# Patient Record
Sex: Female | Born: 1965 | Race: Black or African American | Hispanic: No | Marital: Married | Smoking: Never smoker
Health system: Southern US, Community
[De-identification: ages and names within clinical notes are randomized; demographics above are authoritative.]

## PROBLEM LIST (undated history)

## (undated) DIAGNOSIS — K5900 Constipation, unspecified: Secondary | ICD-10-CM

## (undated) DIAGNOSIS — R7611 Nonspecific reaction to tuberculin skin test without active tuberculosis: Secondary | ICD-10-CM

## (undated) DIAGNOSIS — G43909 Migraine, unspecified, not intractable, without status migrainosus: Secondary | ICD-10-CM

## (undated) DIAGNOSIS — N393 Stress incontinence (female) (male): Secondary | ICD-10-CM

## (undated) HISTORY — DX: Nonspecific reaction to tuberculin skin test without active tuberculosis: R76.11

## (undated) HISTORY — DX: Stress incontinence (female) (male): N39.3

---

## 1997-12-07 HISTORY — PX: TUBAL LIGATION: SHX77

## 2011-08-09 LAB — HEMOGLOBIN A1C: Hgb A1c MFr Bld: 6 % (ref 4.0–6.0)

## 2011-08-09 LAB — BASIC METABOLIC PANEL
Creatinine: 0.6 mg/dL (ref <=1.1)
Glucose: 78 mg/dL
Potassium: 4.3 mmol/L (ref 3.4–5.3)
Sodium: 142 mmol/L (ref 137–147)

## 2011-09-05 ENCOUNTER — Emergency Department: Payer: Self-pay | Admitting: Emergency Medicine

## 2011-09-11 ENCOUNTER — Encounter: Payer: Self-pay | Admitting: Family Medicine

## 2011-09-11 ENCOUNTER — Ambulatory Visit (INDEPENDENT_AMBULATORY_CARE_PROVIDER_SITE_OTHER): Payer: Managed Care, Other (non HMO) | Admitting: Family Medicine

## 2011-09-11 VITALS — BP 140/100 | HR 80 | Temp 98.7°F | Ht 63.0 in | Wt 174.5 lb

## 2011-09-11 DIAGNOSIS — Z1231 Encounter for screening mammogram for malignant neoplasm of breast: Secondary | ICD-10-CM

## 2011-09-11 DIAGNOSIS — N393 Stress incontinence (female) (male): Secondary | ICD-10-CM | POA: Insufficient documentation

## 2011-09-11 DIAGNOSIS — R7611 Nonspecific reaction to tuberculin skin test without active tuberculosis: Secondary | ICD-10-CM

## 2011-09-11 DIAGNOSIS — Z0000 Encounter for general adult medical examination without abnormal findings: Secondary | ICD-10-CM

## 2011-09-11 DIAGNOSIS — E559 Vitamin D deficiency, unspecified: Secondary | ICD-10-CM | POA: Insufficient documentation

## 2011-09-11 NOTE — Patient Instructions (Signed)
Good to see you. Please stop by to see Shirlee Limerick on your way out.   Constipation in Adults Constipation is having fewer than 2 bowel movements per week. Usually, the stools are hard. As we grow older, constipation is more common. If you try to fix constipation with laxatives, the problem may get worse. This is because laxatives taken over a long period of time make the colon muscles weaker. A low-fiber diet, not taking in enough fluids, and taking some medicines may make these problems worse. MEDICATIONS THAT MAY CAUSE CONSTIPATION  Water pills (diuretics).  Calcium channel blockers (used to control blood pressure and for the heart).   Certain pain medicines (narcotics).   Anticholinergics.  Anti-inflammatory agents.   Antacids that contain aluminum.   DISEASES THAT CONTRIBUTE TO CONSTIPATION  Diabetes.  Parkinson's disease.   Dementia.   Stroke.  Depression.   Illnesses that cause problems with salt and water metabolism.   HOME CARE INSTRUCTIONS  Constipation is usually best cared for without medicines. Increasing dietary fiber and eating more fruits and vegetables is the best way to manage constipation.   Slowly increase fiber intake to 25 to 38 grams per day. Whole grains, fruits, vegetables, and legumes are good sources of fiber. A dietitian can further help you incorporate high-fiber foods into your diet.   Drink enough water and fluids to keep your urine clear or pale yellow.   A fiber supplement may be added to your diet if you cannot get enough fiber from foods.   Increasing your activities also helps improve regularity.   Suppositories, as suggested by your caregiver, will also help. If you are using antacids, such as aluminum or calcium containing products, it will be helpful to switch to products containing magnesium if your caregiver says it is okay.   If you have been given a liquid injection (enema) today, this is only a temporary measure. It should not be relied  on for treatment of longstanding (chronic) constipation.   Stronger measures, such as magnesium sulfate, should be avoided if possible. This may cause uncontrollable diarrhea. Using magnesium sulfate may not allow you time to make it to the bathroom.  SEEK IMMEDIATE MEDICAL CARE IF:  There is bright red blood in the stool.   The constipation stays for more than 4 days.   There is belly (abdominal) or rectal pain.   You do not seem to be getting better.   You have any questions or concerns.  MAKE SURE YOU:  Understand these instructions.   Will watch your condition.   Will get help right away if you are not doing well or get worse.  Document Released: 08/21/2004 Document Re-Released: 02/17/2010 [facility] Patient Information 2011 [place], [place].

## 2011-09-11 NOTE — Progress Notes (Signed)
Subjective:    Patient ID: Colleen Haley, female    DOB: [birth year], 45 y.o.   MRN: [id number]  HPI  45 yo female here to establish care.  Recently moved here from [place]. 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  G2P2, last pap smear was 04/2011.  No h/o abnormal pap smears.  Vitamin D deficiency, Vit D was 13 in April.  She was feeling tired at the time but was not sure if it was because she was working full time and going to school. Taking Vit D 1000 IU daily.  Stress incontinence- ongoing issue for several years, getting progressively worse. Tried Kegel exercises, not helping. No dysuria.   Patient Active Problem List  Diagnoses  . Routine general medical examination at a health care facility  . Stress incontinence, female  . Positive TB test   Past Medical History  Diagnosis Date  . Positive TB test   . Stress incontinence, female    Past Surgical History  Procedure Date  . Tubal ligation 1999   History  Substance Use Topics  . Smoking status: Never Smoker   . Smokeless tobacco: Not on file  . Alcohol Use: 0.5 oz/week    1 drink(s) per week   Family History  Problem Relation Age of Onset  . Hyperlipidemia Mother   . Hypertension Mother   . Diabetes Mother   . Hyperlipidemia Father   . Hypertension Father   . Glaucoma Father    No Known Allergies No current outpatient prescriptions on file prior to visit.   The PMH, PSH, Social History, Family History, Medications, and allergies have been reviewed in [facility], and have been updated if relevant.   Review of Systems See HPI Patient reports no  vision/ hearing changes,anorexia, weight change, fever ,adenopathy, persistant / recurrent hoarseness, swallowing issues, chest pain, edema,persistant / recurrent cough, hemoptysis, dyspnea(rest, exertional, paroxysmal nocturnal), gastrointestinal  bleeding (melena, rectal bleeding), abdominal pain, excessive heart burn, GU symptoms(dysuria, hematuria, pyuria,  voiding/incontinence  Issues) syncope, focal weakness, severe memory loss, concerning skin lesions, depression, anxiety, abnormal bruising/bleeding, major joint swelling, breast masses or abnormal vaginal bleeding.       Objective:   Physical Exam BP 140/100  Pulse 80  Temp(Src) 98.7 F (37.1 C) (Oral)  Ht 5\' 3"  (1.6 m)  Wt 174 lb 8 oz (79.153 kg)  BMI 30.91 kg/m2  General:  Well-developed,well-nourished,in no acute distress; alert,appropriate and cooperative throughout examination Head:  normocephalic and atraumatic.   Eyes:  vision grossly intact, pupils equal, pupils round, and pupils reactive to light.   Ears:  R ear normal and L ear normal.   Nose:  no external deformity.   Mouth:  good dentition.   Neck:  No deformities, masses, or tenderness noted. Lungs:  Normal respiratory effort, chest expands symmetrically. Lungs are clear to auscultation, no crackles or wheezes. Heart:  Normal rate and regular rhythm. S1 and S2 normal without gallop, murmur, click, rub or other extra sounds. Abdomen:  Bowel sounds positive,abdomen soft and non-tender without masses, organomegaly or hernias noted. Msk:  No deformity or scoliosis noted of thoracic or lumbar spine.   Extremities:  No clubbing, cyanosis, edema, or deformity noted with normal full range of motion of all joints.   Neurologic:  alert & oriented X3 and gait normal.   Skin:  Intact without suspicious lesions or rashes Cervical Nodes:  No lymphadenopathy noted Axillary Nodes:  No palpable lymphadenopathy Psych:  Cognition and judgment appear intact. Alert and cooperative  with normal attention span and concentration. No apparent delusions, illusions, hallucinations     Assessment & Plan:   1. Routine general medical examination at a health care facility   Reviewed preventive care protocols, scheduled due services, and updated immunizations Discussed nutrition, exercise, diet, and healthy lifestyle.  MM Digital Screening  2.  Stress incontinence, female   Deteriorated.  Will refer to urology for further work up and tx. Ambulatory referral to Urology  3. Vitamin D deficiency  Vitamin D 25 hydroxy

## 2011-09-15 ENCOUNTER — Telehealth: Payer: Self-pay | Admitting: *Deleted

## 2011-09-15 ENCOUNTER — Other Ambulatory Visit: Payer: Self-pay | Admitting: *Deleted

## 2011-09-15 DIAGNOSIS — N390 Urinary tract infection, site not specified: Secondary | ICD-10-CM

## 2011-09-15 MED ORDER — VITAMIN D3 1.25 MG (50000 UT) PO CAPS: 1.0000 | ORAL_CAPSULE | ORAL | Status: DC

## 2011-09-15 MED ORDER — CIPROFLOXACIN HCL 500 MG PO TABS: 500.0000 mg | ORAL_TABLET | Freq: Two times a day (BID) | ORAL | Status: AC

## 2011-09-15 NOTE — Telephone Encounter (Signed)
Patient is having pain with urination and had positive nitrates on a urine dip with a trace of blood.  We will call in Cipro for her.

## 2011-09-23 ENCOUNTER — Telehealth: Payer: Self-pay | Admitting: *Deleted

## 2011-09-23 MED ORDER — SUMATRIPTAN SUCCINATE 50 MG PO TABS: 50.0000 mg | ORAL_TABLET | Freq: Once | ORAL | Status: DC | PRN

## 2011-09-23 NOTE — Telephone Encounter (Signed)
rx sent

## 2011-09-23 NOTE — Telephone Encounter (Signed)
Pt called with her BP readings.  10/8- 137/92, 10/9- 146/98, 10/10- 128/86, 10/15- 137/89, 10/16- 143/93, 10/17- 128/89.  She also wanted to let you know that the reglan that she was given for her migraines isn't helping.

## 2011-09-23 NOTE — Telephone Encounter (Signed)
Ok BP looks good. Reglan is an antinausea medication to treat nausea associated with migraines, it does not prevent or treat actual migraine pain. We can try Imitrex if she is interested.

## 2011-09-23 NOTE — Telephone Encounter (Signed)
Patient advised as instructed via telephone.  She would like Rx for Imitrex sent to [facility] in [place].

## 2011-09-25 ENCOUNTER — Ambulatory Visit: Payer: Self-pay | Admitting: Family Medicine

## 2011-10-05 ENCOUNTER — Encounter: Payer: Self-pay | Admitting: Family Medicine

## 2011-10-06 ENCOUNTER — Encounter: Payer: Self-pay | Admitting: *Deleted

## 2012-07-13 ENCOUNTER — Telehealth: Payer: Self-pay | Admitting: *Deleted

## 2012-07-13 DIAGNOSIS — N946 Dysmenorrhea, unspecified: Secondary | ICD-10-CM

## 2012-07-13 MED ORDER — IBUPROFEN 800 MG PO TABS: 800.0000 mg | ORAL_TABLET | Freq: Three times a day (TID) | ORAL | Status: AC | PRN

## 2012-07-13 NOTE — Telephone Encounter (Signed)
Patient is having cramping with her menses and would like ibuprofen called in.

## 2012-07-20 ENCOUNTER — Telehealth: Payer: Self-pay | Admitting: Family Medicine

## 2012-07-20 DIAGNOSIS — R32 Unspecified urinary incontinence: Secondary | ICD-10-CM

## 2012-07-20 NOTE — Telephone Encounter (Signed)
I apologize for the over sight. Referral placed.

## 2012-07-20 NOTE — Telephone Encounter (Signed)
Patient was seen in October,2012 and was told she would get a referral to a Urologist.  It looks like a referral wasn't put in Epic.  Patient is having issues and wants to know if she can be referred to a Urologist in [place] as soon as possible.

## 2012-07-20 NOTE — Addendum Note (Signed)
Addended by: Dianne Dun on: 07/20/2012 03:58 PM   Modules accepted: Orders

## 2012-08-15 ENCOUNTER — Telehealth: Payer: Self-pay | Admitting: *Deleted

## 2012-08-15 NOTE — Telephone Encounter (Signed)
Office note and labs from 09/11/11 faxed to [facility], ok'd by Dr. Dayton Martes.  Pt has appt there on 08/19/12.

## 2012-08-17 ENCOUNTER — Telehealth: Payer: Self-pay

## 2012-08-17 NOTE — Telephone Encounter (Signed)
Patient had a positive UTI we called in Cipro 500 mg to her pharmacy.

## 2012-08-29 ENCOUNTER — Other Ambulatory Visit: Payer: Managed Care, Other (non HMO) | Admitting: *Deleted

## 2012-08-29 DIAGNOSIS — Z139 Encounter for screening, unspecified: Secondary | ICD-10-CM

## 2012-08-29 DIAGNOSIS — E559 Vitamin D deficiency, unspecified: Secondary | ICD-10-CM

## 2012-08-29 NOTE — Progress Notes (Signed)
Patient is here for blood work for her [employer].

## 2012-08-29 NOTE — Addendum Note (Signed)
Addended by: Barbara Cower on: 08/29/2012 11:21 AM   Modules accepted: Orders

## 2012-08-30 ENCOUNTER — Other Ambulatory Visit: Payer: Self-pay | Admitting: Obstetrics & Gynecology

## 2012-08-30 ENCOUNTER — Telehealth: Payer: Self-pay | Admitting: *Deleted

## 2012-08-30 DIAGNOSIS — N920 Excessive and frequent menstruation with regular cycle: Secondary | ICD-10-CM

## 2012-08-30 DIAGNOSIS — D219 Benign neoplasm of connective and other soft tissue, unspecified: Secondary | ICD-10-CM

## 2012-08-30 LAB — HEMOGLOBIN A1C: Hgb A1c MFr Bld: 5.6 % (ref 4.8–5.6)

## 2012-08-30 LAB — COMPREHENSIVE METABOLIC PANEL
ALT: 16 IU/L (ref 0–32)
AST: 11 IU/L (ref 0–40)
Alkaline Phosphatase: 59 IU/L (ref 42–107)
BUN/Creatinine Ratio: 9 (ref 9–23)
Chloride: 104 mmol/L (ref 97–108)
GFR calc Af Amer: 114 mL/min/{1.73_m2} (ref >59)
Glucose: 88 mg/dL (ref 65–99)
Potassium: 4.2 mmol/L (ref 3.5–5.2)
Sodium: 139 mmol/L (ref 134–144)
Total Bilirubin: 0.2 mg/dL (ref 0.0–1.2)

## 2012-08-30 LAB — LIPID PANEL
Chol/HDL Ratio: 4.5 ratio units — ABNORMAL HIGH (ref 0.0–4.4)
LDL Calculated: 134 mg/dL — ABNORMAL HIGH (ref 0–99)
Triglycerides: 147 mg/dL (ref 0–149)
VLDL Cholesterol Cal: 29 mg/dL (ref 5–40)

## 2012-08-30 NOTE — Telephone Encounter (Signed)
Ultrasound called to ask that we add ob trans vag and abdominal.

## 2012-08-30 NOTE — Progress Notes (Signed)
Patient reports having menorrhagia and history of fibroids, last pelvic ultrasound was in 2005.  Will schedule for another pelvic ultrasound, patient will also be scheduled for annual exam and pap smear, will also undergo endometrial biopsy during this visit. Will discuss management of menorrhagia and fibroids during the visit, pending results of the various evaluation studies.

## 2012-09-02 ENCOUNTER — Telehealth: Payer: Self-pay | Admitting: *Deleted

## 2012-09-02 DIAGNOSIS — E559 Vitamin D deficiency, unspecified: Secondary | ICD-10-CM

## 2012-09-02 MED ORDER — VITAMIN D (ERGOCALCIFEROL) 1.25 MG (50000 UNIT) PO CAPS: 50000.0000 [IU] | ORAL_CAPSULE | ORAL | Status: DC

## 2012-09-02 NOTE — Telephone Encounter (Signed)
Patient notified meds  Were called in.

## 2012-09-02 NOTE — Telephone Encounter (Signed)
Med called to wrong mail order.  It has been called in correctly.

## 2012-09-07 ENCOUNTER — Encounter: Payer: Self-pay | Admitting: Obstetrics & Gynecology

## 2012-09-07 ENCOUNTER — Ambulatory Visit (INDEPENDENT_AMBULATORY_CARE_PROVIDER_SITE_OTHER): Payer: Managed Care, Other (non HMO) | Admitting: Obstetrics & Gynecology

## 2012-09-07 ENCOUNTER — Other Ambulatory Visit: Payer: Self-pay | Admitting: Obstetrics & Gynecology

## 2012-09-07 VITALS — BP 118/84 | HR 83 | Ht 63.0 in | Wt 174.0 lb

## 2012-09-07 DIAGNOSIS — Z124 Encounter for screening for malignant neoplasm of cervix: Secondary | ICD-10-CM

## 2012-09-07 DIAGNOSIS — D219 Benign neoplasm of connective and other soft tissue, unspecified: Secondary | ICD-10-CM

## 2012-09-07 DIAGNOSIS — D259 Leiomyoma of uterus, unspecified: Secondary | ICD-10-CM

## 2012-09-07 DIAGNOSIS — Z01419 Encounter for gynecological examination (general) (routine) without abnormal findings: Secondary | ICD-10-CM

## 2012-09-07 DIAGNOSIS — N920 Excessive and frequent menstruation with regular cycle: Secondary | ICD-10-CM

## 2012-09-07 DIAGNOSIS — Z1151 Encounter for screening for human papillomavirus (HPV): Secondary | ICD-10-CM

## 2012-09-07 NOTE — Progress Notes (Signed)
  Subjective:     Colleen Haley is a 46 y.o. G2P2 female who is here for a comprehensive gynecologic exam. The patient reports menorrhagia for a few years, has a history of fibroids.  She is scheduled for a pelvic ultrasound on 09/09/12, and is also scheduled to return on 09/08/12 for an endometrial biopsy.  She denies any symptoms of anemia. Denies any other GYN concerns.  History   Social History  . Marital Status: Married    Spouse Name: N/A    Number of Children: N/A  . Years of Education: N/A   Occupational History  . Not on file.   Social History Main Topics  . Smoking status: Never Smoker   . Smokeless tobacco: Never Used  . Alcohol Use: 0.5 oz/week    1 drink(s) per week  . Drug Use: No  . Sexually Active: Yes -- Female partner(s)   Other Topics Concern  . 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   Health Maintenance  Topic Date Due  . Tetanus/tdap  10/21/1986  . Influenza Vaccine  08/07/2012  . Pap Smear  04/30/2013    The following portions of the patient's history were reviewed and updated as appropriate: allergies, current medications, past family history, past medical history, past social history, past surgical history and problem list.  Review of Systems Pertinent items are noted in HPI.   Objective:   Blood pressure 118/84, pulse 83, height 5\' 3"  (1.6 m), weight 174 lb (78.926 kg), last menstrual period 08/29/2012. GENERAL: Well-developed, well-nourished female in no acute distress.  HEENT: Normocephalic, atraumatic. Sclerae anicteric.  NECK: Supple. Normal thyroid.  LUNGS: Clear to auscultation bilaterally.  HEART: Regular rate and rhythm. BREASTS: Symmetric with everted nipples. No masses, skin changes, nipple drainage, or lymphadenopathy. ABDOMEN: Soft, nontender, nondistended. No organomegaly. PELVIC: Normal external female genitalia.  Piercing with ring noted through the middle-inferior portion of the right labium majus.  Vagina is pink and rugated.  Normal discharge. Normal multiparous cervix contour. Pap smear obtained. Uterus is globally enlarged, about 16 week size. No adnexal mass palpated and no adnexal tenderness.  EXTREMITIES: No cyanosis, clubbing, or edema, 2+ distal pulses.   Assessment:    Healthy female exam.  Menorrhagia Fibroids     Plan:    Pap done, follow up results and manage accordingly. Mammogram scheduled by patient, will follow up results Will also follow up results of pelvic ultrasound and endometrial biopsy and manage accordingly Routine preventative health maintenance measures emphasized See After Visit Summary for other Counseling Recommendations

## 2012-09-07 NOTE — Patient Instructions (Addendum)
Dysfunctional Uterine Bleeding Normally, menstrual periods begin between ages 11 to 17 in young women. A normal menstrual cycle/period may begin every 23 days up to 35 days and lasts from 1 to 7 days. Around 12 to 14 days before your menstrual period starts, ovulation (ovary produces an egg) occurs. When counting the time between menstrual periods, count from the first day of bleeding of the previous period to the first day of bleeding of the next period. Dysfunctional (abnormal) uterine bleeding is bleeding that is different from a normal menstrual period. Your periods may come earlier or later than usual. They may be lighter, have blood clots or be heavier. You may have bleeding between periods, or you may skip one period or more. You may have bleeding after sexual intercourse, bleeding after menopause, or no menstrual period. CAUSES   Pregnancy (normal, miscarriage, tubal).  IUDs (intrauterine device, birth control).  Birth control pills.  Hormone treatment.  Menopause.  Infection of the cervix.  Blood clotting problems.  Infection of the inside lining of the uterus.  Endometriosis, inside lining of the uterus growing in the pelvis and other female organs.  Adhesions (scar tissue) inside the uterus.  Obesity or severe weight loss.  Uterine polyps inside the uterus.  Cancer of the vagina, cervix, or uterus.  Ovarian cysts or polycystic ovary syndrome.  Medical problems (diabetes, thyroid disease).  Uterine fibroids (noncancerous tumor).  Problems with your female hormones.  Endometrial hyperplasia, very thick lining and enlarged cells inside of the uterus.  Medicines that interfere with ovulation.  Radiation to the pelvis or abdomen.  Chemotherapy. DIAGNOSIS   Your doctor will discuss the history of your menstrual periods, medicines you are taking, changes in your weight, stress in your life, and any medical problems you may have.  Your doctor will do a physical  and pelvic examination.  Your doctor may want to perform certain tests to make a diagnosis, such as:  Pap test.  Blood tests.  Cultures for infection.  CT scan.  Ultrasound.  Hysteroscopy.  Laparoscopy.  MRI.  Hysterosalpingography.  D and C.  Endometrial biopsy. TREATMENT  Treatment will depend on the cause of the dysfunctional uterine bleeding (DUB). Treatment may include:  Observing your menstrual periods for a couple of months.  Prescribing medicines for medical problems, including:  Antibiotics.  Hormones.  Birth control pills.  Removing an IUD (intrauterine device, birth control).  Surgery:  D and C (scrape and remove tissue from inside the uterus).  Laparoscopy (examine inside the abdomen with a lighted tube).  Uterine ablation (destroy lining of the uterus with electrical current, laser, heat, or freezing).  Hysteroscopy (examine cervix and uterus with a lighted tube).  Hysterectomy (remove the uterus). HOME CARE INSTRUCTIONS   If medicines were prescribed, take exactly as directed. Do not change or switch medicines without consulting your caregiver.  Long term heavy bleeding may result in iron deficiency. Your caregiver may have prescribed iron pills. They help replace the iron that your body lost from heavy bleeding. Take exactly as directed.  Do not take aspirin or medicines that contain aspirin one week before or during your menstrual period. Aspirin may make the bleeding worse.  If you need to change your sanitary pad or tampon more than once every 2 hours, stay in bed with your feet elevated and a cold pack on your lower abdomen. Rest as much as possible, until the bleeding stops or slows down.  Eat well-balanced meals. Eat foods high in iron. Examples   are:  Leafy green vegetables.  Whole-grain breads and cereals.  Eggs.  Meat.  Liver.  Do not try to lose weight until the abnormal bleeding has stopped and your blood iron level is  back to normal. Do not lift more than ten pounds or do strenuous activities when you are bleeding.  For a couple of months, make note on your calendar, marking the start and ending of your period, and the type of bleeding (light, medium, heavy, spotting, clots or missed periods). This is for your caregiver to better evaluate your problem. SEEK MEDICAL CARE IF:   You develop nausea (feeling sick to your stomach) and vomiting, dizziness, or diarrhea while you are taking your medicine.  You are getting lightheaded or weak.  You have any problems that may be related to the medicine you are taking.  You develop pain with your DUB.  You want to remove your IUD.  You want to stop or change your birth control pills or hormones.  You have any type of abnormal bleeding mentioned above.  You are over 45 years old and have not had a menstrual period yet.  You are 46 years old and you are still having menstrual periods.  You have any of the symptoms mentioned above.  You develop a rash. SEEK IMMEDIATE MEDICAL CARE IF:   An oral temperature above 102 F (38.9 C) develops.  You develop chills.  You are changing your sanitary pad or tampon more than once an hour.  You develop abdominal pain.  You pass out or faint. Document Released: 11/20/2000 Document Revised: 02/15/2012 Document Reviewed: 10/22/2009 [facility] Patient Information 2013 [place], [place].  Preventive Care for Adults, Female A healthy lifestyle and preventive care can promote health and wellness. Preventive health guidelines for women include the following key practices.  A routine yearly physical is a good way to check with your caregiver about your health and preventive screening. It is a chance to share any concerns and updates on your health, and to receive a thorough exam.  Visit your dentist for a routine exam and preventive care every 6 months. Brush your teeth twice a day and floss once a day. Good oral hygiene  prevents tooth decay and gum disease.  The frequency of eye exams is based on your age, health, family medical history, use of contact lenses, and other factors. Follow your caregiver's recommendations for frequency of eye exams.  Eat a healthy diet. Foods like vegetables, fruits, whole grains, low-fat dairy products, and lean protein foods contain the nutrients you need without too many calories. Decrease your intake of foods high in solid fats, added sugars, and salt. Eat the right amount of calories for you.Get information about a proper diet from your caregiver, if necessary.  Regular physical exercise is one of the most important things you can do for your health. Most adults should get at least 150 minutes of moderate-intensity exercise (any activity that increases your heart rate and causes you to sweat) each week. In addition, most adults need muscle-strengthening exercises on 2 or more days a week.  Maintain a healthy weight. The body mass index (BMI) is a screening tool to identify possible weight problems. It provides an estimate of body fat based on height and weight. Your caregiver can help determine your BMI, and can help you achieve or maintain a healthy weight.For adults 20 years and older:  A BMI below 18.5 is considered underweight.  A BMI of 18.5 to 24.9 is normal.  A  BMI of 25 to 29.9 is considered overweight.  A BMI of 30 and above is considered obese.  Maintain normal blood lipids and cholesterol levels by exercising and minimizing your intake of saturated fat. Eat a balanced diet with plenty of fruit and vegetables. Blood tests for lipids and cholesterol should begin at age 63 and be repeated every 5 years. If your lipid or cholesterol levels are high, you are over 50, or you are at high risk for heart disease, you may need your cholesterol levels checked more frequently.Ongoing high lipid and cholesterol levels should be treated with medicines if diet and exercise are not  effective.  If you smoke, find out from your caregiver how to quit. If you do not use tobacco, do not start.  If you are pregnant, do not drink alcohol. If you are breastfeeding, be very cautious about drinking alcohol. If you are not pregnant and choose to drink alcohol, do not exceed 1 drink per day. One drink is considered to be 12 ounces (355 mL) of beer, 5 ounces (148 mL) of wine, or 1.5 ounces (44 mL) of liquor.  Avoid use of street drugs. Do not share needles with anyone. Ask for help if you need support or instructions about stopping the use of drugs.  High blood pressure causes heart disease and increases the risk of stroke. Your blood pressure should be checked at least every 1 to 2 years. Ongoing high blood pressure should be treated with medicines if weight loss and exercise are not effective.  If you are 51 to 45 years old, ask your caregiver if you should take aspirin to prevent strokes.  Diabetes screening involves taking a blood sample to check your fasting blood sugar level. This should be done once every 3 years, after age 77, if you are within normal weight and without risk factors for diabetes. Testing should be considered at a younger age or be carried out more frequently if you are overweight and have at least 1 risk factor for diabetes.  Breast cancer screening is essential preventive care for women. You should practice "breast self-awareness." This means understanding the normal appearance and feel of your breasts and may include breast self-examination. Any changes detected, no matter how small, should be reported to a caregiver. Women in their 19s and 30s should have a clinical breast exam (CBE) by a caregiver as part of a regular health exam every 1 to 3 years. After age 24, women should have a CBE every year. Starting at age 76, women should consider having a mammography (breast X-ray test) every year. Women who have a family history of breast cancer should talk to their  caregiver about genetic screening. Women at a high risk of breast cancer should talk to their caregivers about having magnetic resonance imaging (MRI) and a mammography every year.  The Pap test is a screening test for cervical cancer. A Pap test can show cell changes on the cervix that might become cervical cancer if left untreated. A Pap test is a procedure in which cells are obtained and examined from the lower end of the uterus (cervix).  Women should have a Pap test starting at age 21.  Between ages 33 and 62, Pap tests should be repeated every 2 years.  Beginning at age 70, you should have a Pap test every 3 years as long as the past 3 Pap tests have been normal.  Some women have medical problems that increase the chance of getting cervical cancer.  Talk to your caregiver about these problems. It is especially important to talk to your caregiver if a new problem develops soon after your last Pap test. In these cases, your caregiver may recommend more frequent screening and Pap tests.  The above recommendations are the same for women who have or have not gotten the vaccine for human papillomavirus (HPV).  If you had a hysterectomy for a problem that was not cancer or a condition that could lead to cancer, then you no longer need Pap tests. Even if you no longer need a Pap test, a regular exam is a good idea to make sure no other problems are starting.  If you are between ages 51 and 76, and you have had normal Pap tests going back 10 years, you no longer need Pap tests. Even if you no longer need a Pap test, a regular exam is a good idea to make sure no other problems are starting.  If you have had past treatment for cervical cancer or a condition that could lead to cancer, you need Pap tests and screening for cancer for at least 20 years after your treatment.  If Pap tests have been discontinued, risk factors (such as a new sexual partner) need to be reassessed to determine if screening  should be resumed.  The HPV test is an additional test that may be used for cervical cancer screening. The HPV test looks for the virus that can cause the cell changes on the cervix. The cells collected during the Pap test can be tested for HPV. The HPV test could be used to screen women aged 30 years and older, and should be used in women of any age who have unclear Pap test results. After the age of 54, women should have HPV testing at the same frequency as a Pap test.  Colorectal cancer can be detected and often prevented. Most routine colorectal cancer screening begins at the age of 15 and continues through age 62. However, your caregiver may recommend screening at an earlier age if you have risk factors for colon cancer. On a yearly basis, your caregiver may provide home test kits to check for hidden blood in the stool. Use of a small camera at the end of a tube, to directly examine the colon (sigmoidoscopy or colonoscopy), can detect the earliest forms of colorectal cancer. Talk to your caregiver about this at age 42, when routine screening begins. Direct examination of the colon should be repeated every 5 to 10 years through age 16, unless early forms of pre-cancerous polyps or small growths are found.  Hepatitis C blood testing is recommended for all people born from [birth year] through 1965 and any individual with known risks for hepatitis C.  Practice safe sex. Use condoms and avoid high-risk sexual practices to reduce the spread of sexually transmitted infections (STIs). STIs include gonorrhea, chlamydia, syphilis, trichomonas, herpes, HPV, and human immunodeficiency virus (HIV). Herpes, HIV, and HPV are viral illnesses that have no cure. They can result in disability, cancer, and death. Sexually active women aged 64 and younger should be checked for chlamydia. Older women with new or multiple partners should also be tested for chlamydia. Testing for other STIs is recommended if you are sexually active  and at increased risk.  Osteoporosis is a disease in which the bones lose minerals and strength with aging. This can result in serious bone fractures. The risk of osteoporosis can be identified using a bone density scan. Women ages 71 and over  and women at risk for fractures or osteoporosis should discuss screening with their caregivers. Ask your caregiver whether you should take a calcium supplement or vitamin D to reduce the rate of osteoporosis.  Menopause can be associated with physical symptoms and risks. Hormone replacement therapy is available to decrease symptoms and risks. You should talk to your caregiver about whether hormone replacement therapy is right for you.  Use sunscreen with sun protection factor (SPF) of 30 or more. Apply sunscreen liberally and repeatedly throughout the day. You should seek shade when your shadow is shorter than you. Protect yourself by wearing long sleeves, pants, a wide-brimmed hat, and sunglasses year round, whenever you are outdoors.  Once a month, do a whole body skin exam, using a mirror to look at the skin on your back. Notify your caregiver of new moles, moles that have irregular borders, moles that are larger than a pencil eraser, or moles that have changed in shape or color.  Stay current with required immunizations.  Influenza. You need a dose every fall (or winter). The composition of the flu vaccine changes each year, so being vaccinated once is not enough.  Pneumococcal polysaccharide. You need 1 to 2 doses if you smoke cigarettes or if you have certain chronic medical conditions. You need 1 dose at age 36 (or older) if you have never been vaccinated.  Tetanus, diphtheria, pertussis (Tdap, Td). Get 1 dose of Tdap vaccine if you are younger than age 3, are over 73 and have contact with an infant, are a Research scientist (physical sciences), are pregnant, or simply want to be protected from whooping cough. After that, you need a Td booster dose every 10 years. Consult  your caregiver if you have not had at least 3 tetanus and diphtheria-containing shots sometime in your life or have a deep or dirty wound.  HPV. You need this vaccine if you are a woman age 63 or younger. The vaccine is given in 3 doses over 6 months.  Measles, mumps, rubella (MMR). You need at least 1 dose of MMR if you were born in 1957 or later. You may also need a second dose.  Meningococcal. If you are age 42 to 39 and a first-year college student living in a residence hall, or have one of several medical conditions, you need to get vaccinated against meningococcal disease. You may also need additional booster doses.  Zoster (shingles). If you are age 30 or older, you should get this vaccine.  Varicella (chickenpox). If you have never had chickenpox or you were vaccinated but received only 1 dose, talk to your caregiver to find out if you need this vaccine.  Hepatitis A. You need this vaccine if you have a specific risk factor for hepatitis A virus infection or you simply wish to be protected from this disease. The vaccine is usually given as 2 doses, 6 to 18 months apart.  Hepatitis B. You need this vaccine if you have a specific risk factor for hepatitis B virus infection or you simply wish to be protected from this disease. The vaccine is given in 3 doses, usually over 6 months. Preventive Services / Frequency Ages 72 to 43  Blood pressure check.** / Every 1 to 2 years.  Lipid and cholesterol check.** / Every 5 years beginning at age 84.  Clinical breast exam.** / Every 3 years for women in their 61s and 30s.  Pap test.** / Every 2 years from ages 29 through 46. Every 3 years starting at age  30 through age 35 or 54 with a history of 3 consecutive normal Pap tests.  HPV screening.** / Every 3 years from ages 44 through ages 49 to 71 with a history of 3 consecutive normal Pap tests.  Hepatitis C blood test.** / For any individual with known risks for hepatitis C.  Skin self-exam.  / Monthly.  Influenza immunization.** / Every year.  Pneumococcal polysaccharide immunization.** / 1 to 2 doses if you smoke cigarettes or if you have certain chronic medical conditions.  Tetanus, diphtheria, pertussis (Tdap, Td) immunization. / A one-time dose of Tdap vaccine. After that, you need a Td booster dose every 10 years.  HPV immunization. / 3 doses over 6 months, if you are 8 and younger.  Measles, mumps, rubella (MMR) immunization. / You need at least 1 dose of MMR if you were born in 1957 or later. You may also need a second dose.  Meningococcal immunization. / 1 dose if you are age 41 to 46 and a first-year college student living in a residence hall, or have one of several medical conditions, you need to get vaccinated against meningococcal disease. You may also need additional booster doses.  Varicella immunization.** / Consult your caregiver.  Hepatitis A immunization.** / Consult your caregiver. 2 doses, 6 to 18 months apart.  Hepatitis B immunization.** / Consult your caregiver. 3 doses usually over 6 months. Ages 11 to 59  Blood pressure check.** / Every 1 to 2 years.  Lipid and cholesterol check.** / Every 5 years beginning at age 4.  Clinical breast exam.** / Every year after age 31.  Mammogram.** / Every year beginning at age 29 and continuing for as long as you are in good health. Consult with your caregiver.  Pap test.** / Every 3 years starting at age 72 through age 64 or 26 with a history of 3 consecutive normal Pap tests.  HPV screening.** / Every 3 years from ages 15 through ages 74 to 46 with a history of 3 consecutive normal Pap tests.  Fecal occult blood test (FOBT) of stool. / Every year beginning at age 17 and continuing until age 52. You may not need to do this test if you get a colonoscopy every 10 years.  Flexible sigmoidoscopy or colonoscopy.** / Every 5 years for a flexible sigmoidoscopy or every 10 years for a colonoscopy beginning at age  80 and continuing until age 37.  Hepatitis C blood test.** / For all people born from [birth year] through 1964 and any individual with known risks for hepatitis C.  Skin self-exam. / Monthly.  Influenza immunization.** / Every year.  Pneumococcal polysaccharide immunization.** / 1 to 2 doses if you smoke cigarettes or if you have certain chronic medical conditions.  Tetanus, diphtheria, pertussis (Tdap, Td) immunization.** / A one-time dose of Tdap vaccine. After that, you need a Td booster dose every 10 years.  Measles, mumps, rubella (MMR) immunization. / You need at least 1 dose of MMR if you were born in 1957 or later. You may also need a second dose.  Varicella immunization.** / Consult your caregiver.  Meningococcal immunization.** / Consult your caregiver.  Hepatitis A immunization.** / Consult your caregiver. 2 doses, 6 to 18 months apart.  Hepatitis B immunization.** / Consult your caregiver. 3 doses, usually over 6 months. Ages 76 and over  Blood pressure check.** / Every 1 to 2 years.  Lipid and cholesterol check.** / Every 5 years beginning at age 72.  Clinical breast exam.** / Every  year after age 57.  Mammogram.** / Every year beginning at age 72 and continuing for as long as you are in good health. Consult with your caregiver.  Pap test.** / Every 3 years starting at age 75 through age 37 or 60 with a 3 consecutive normal Pap tests. Testing can be stopped between 65 and 70 with 3 consecutive normal Pap tests and no abnormal Pap or HPV tests in the past 10 years.  HPV screening.** / Every 3 years from ages 39 through ages 32 or 52 with a history of 3 consecutive normal Pap tests. Testing can be stopped between 65 and 70 with 3 consecutive normal Pap tests and no abnormal Pap or HPV tests in the past 10 years.  Fecal occult blood test (FOBT) of stool. / Every year beginning at age 41 and continuing until age 46. You may not need to do this test if you get a colonoscopy every  10 years.  Flexible sigmoidoscopy or colonoscopy.** / Every 5 years for a flexible sigmoidoscopy or every 10 years for a colonoscopy beginning at age 82 and continuing until age 25.  Hepatitis C blood test.** / For all people born from [birth year] through 1964 and any individual with known risks for hepatitis C.  Osteoporosis screening.** / A one-time screening for women ages 34 and over and women at risk for fractures or osteoporosis.  Skin self-exam. / Monthly.  Influenza immunization.** / Every year.  Pneumococcal polysaccharide immunization.** / 1 dose at age 1 (or older) if you have never been vaccinated.  Tetanus, diphtheria, pertussis (Tdap, Td) immunization. / A one-time dose of Tdap vaccine if you are over 65 and have contact with an infant, are a Research scientist (physical sciences), or simply want to be protected from whooping cough. After that, you need a Td booster dose every 10 years.  Varicella immunization.** / Consult your caregiver.  Meningococcal immunization.** / Consult your caregiver.  Hepatitis A immunization.** / Consult your caregiver. 2 doses, 6 to 18 months apart.  Hepatitis B immunization.** / Check with your caregiver. 3 doses, usually over 6 months. ** Family history and personal history of risk and conditions may change your caregiver's recommendations. Document Released: 01/19/2002 Document Revised: 02/15/2012 Document Reviewed: 04/20/2011 [facility] Patient Information 2013 [place], [place].  Thank you for enrolling in MyChart. Please follow the instructions below to securely access your online medical record. MyChart allows you to send messages to your doctor, view your test results, manage appointments, and more.   How Do I Sign Up? 1. In your Internet browser, go to [employer] and enter [email or web address]. 2. Click on the Sign Up Now link in the Sign In box. You will see the New Member Sign Up page. 3. Enter your MyChart Access Code exactly as it appears  below. You will not need to use this code after you've completed the sign-up process. If you do not sign up before the expiration date, you must request a new code. MyChart Access Code: [email or web address] Expires: 10/07/2012  8:24 AM  4. Enter your Social Security Number ([email or web address]) and Date of Birth (mm/dd/yyyy) as indicated and click Submit. You will be taken to the next sign-up page. 5. Create a MyChart ID. This will be your MyChart login ID and cannot be changed, so think of one that is secure and easy to remember. 6. Create a MyChart password. You can change your password at any time. 7. Enter your Password Reset Question and Answer. This can be used at a later  time if you forget your password.  8. Enter your e-mail address. You will receive e-mail notification when new information is available in MyChart. 9. Click Sign Up. You can now view your medical record.   Additional Information Remember, MyChart is NOT to be used for urgent needs. For medical emergencies, dial 911.

## 2012-09-08 ENCOUNTER — Other Ambulatory Visit: Payer: Managed Care, Other (non HMO) | Admitting: Obstetrics & Gynecology

## 2012-09-09 ENCOUNTER — Ambulatory Visit (HOSPITAL_COMMUNITY)
Admission: RE | Admit: 2012-09-09 | Discharge: 2012-09-09 | Disposition: A | Discharge status: Home / Self Care | Payer: Managed Care, Other (non HMO) | Source: Ambulatory Visit | Attending: Obstetrics & Gynecology | Admitting: Obstetrics & Gynecology

## 2012-09-09 DIAGNOSIS — D252 Subserosal leiomyoma of uterus: Secondary | ICD-10-CM | POA: Insufficient documentation

## 2012-09-09 DIAGNOSIS — N949 Unspecified condition associated with female genital organs and menstrual cycle: Secondary | ICD-10-CM | POA: Insufficient documentation

## 2012-09-09 DIAGNOSIS — N920 Excessive and frequent menstruation with regular cycle: Secondary | ICD-10-CM | POA: Insufficient documentation

## 2012-09-09 DIAGNOSIS — N926 Irregular menstruation, unspecified: Secondary | ICD-10-CM | POA: Insufficient documentation

## 2012-09-09 DIAGNOSIS — D219 Benign neoplasm of connective and other soft tissue, unspecified: Secondary | ICD-10-CM

## 2012-09-14 ENCOUNTER — Other Ambulatory Visit: Payer: Self-pay | Admitting: Obstetrics & Gynecology

## 2012-09-14 ENCOUNTER — Ambulatory Visit (INDEPENDENT_AMBULATORY_CARE_PROVIDER_SITE_OTHER): Payer: Managed Care, Other (non HMO) | Admitting: Obstetrics & Gynecology

## 2012-09-14 ENCOUNTER — Encounter: Payer: Self-pay | Admitting: Obstetrics & Gynecology

## 2012-09-14 VITALS — BP 138/95 | HR 78 | Ht 63.0 in | Wt 172.0 lb

## 2012-09-14 DIAGNOSIS — D259 Leiomyoma of uterus, unspecified: Secondary | ICD-10-CM

## 2012-09-14 DIAGNOSIS — N920 Excessive and frequent menstruation with regular cycle: Secondary | ICD-10-CM

## 2012-09-14 DIAGNOSIS — D219 Benign neoplasm of connective and other soft tissue, unspecified: Secondary | ICD-10-CM

## 2012-09-14 DIAGNOSIS — Z7189 Other specified counseling: Secondary | ICD-10-CM

## 2012-09-14 DIAGNOSIS — Z712 Person consulting for explanation of examination or test findings: Secondary | ICD-10-CM

## 2012-09-14 NOTE — Patient Instructions (Addendum)
Endometrial Biopsy This is a test in which a tissue sample (a biopsy) is taken from inside the uterus (womb). It is then looked at by a specialist under a microscope to see if the tissue is normal or abnormal. The endometrium is the lining of the uterus. This test helps determine where you are in your menstrual cycle and how hormone levels are affecting the lining of the uterus. Another use for this test is to diagnose endometrial cancer, tuberculosis, polyps, or inflammatory conditions and to evaluate uterine bleeding. PREPARATION FOR TEST No preparation or fasting is necessary. NORMAL FINDINGS No pathologic conditions. Presence of "secretory-type" endometrium 3 to 5 days before to normal menstruation. Ranges for normal findings may vary among different laboratories and hospitals. You should always check with your doctor after having lab work or other tests done to discuss the meaning of your test results and whether your values are considered within normal limits. MEANING OF TEST  Your caregiver will go over the test results with you and discuss the importance and meaning of your results, as well as treatment options and the need for additional tests if necessary. OBTAINING THE TEST RESULTS It is your responsibility to obtain your test results. Ask the lab or department performing the test when and how you will get your results. Document Released: 03/26/2005 Document Revised: 02/15/2012 Document Reviewed: 11/02/2008 [facility] Patient Information 2013 [place], [place]. Hysterectomy Information  A hysterectomy is a procedure where your uterus is surgically removed. It will no longer be possible to have menstrual periods or to become pregnant. The tubes and ovaries can be removed (bilateral salpingo-oopherectomy) during this surgery as well.  REASONS FOR A HYSTERECTOMY  Persistent, abnormal bleeding.  Lasting (chronic) pelvic pain or infection.  The lining of the uterus (endometrium) starts  growing outside the uterus (endometriosis).  The endometrium starts growing in the muscle of the uterus (adenomyosis).  The uterus falls down into the vagina (pelvic organ prolapse).  Symptomatic uterine fibroids.  Precancerous cells.  Cervical cancer or uterine cancer. TYPES OF HYSTERECTOMIES  Supracervical hysterectomy. This type removes the top part of the uterus, but not the cervix.  Total hysterectomy. This type removes the uterus and cervix.  Radical hysterectomy. This type removes the uterus, cervix, and the fibrous tissue that holds the uterus in place in the pelvis (parametrium). WAYS A HYSTERECTOMY CAN BE PERFORMED  Abdominal hysterectomy. A large surgical cut (incision) is made in the abdomen. The uterus is removed through this incision.  Vaginal hysterectomy. An incision is made in the vagina. The uterus is removed through this incision. There are no abdominal incisions.  Conventional laparoscopic hysterectomy. A thin, lighted tube with a camera (laparoscope) is inserted into 3 or 4 small incisions in the abdomen. The uterus is cut into small pieces. The small pieces are removed through the incisions, or they are removed through the vagina.  Laparoscopic assisted vaginal hysterectomy (LAVH). Three or four small incisions are made in the abdomen. Part of the surgery is performed laparoscopically and part vaginally. The uterus is removed through the vagina.  Robot-assisted laparoscopic hysterectomy. A laparoscope is inserted into 3 or 4 small incisions in the abdomen. A computer-controlled device is used to give the surgeon a 3D image. This allows for more precise movements of surgical instruments. The uterus is cut into small pieces and removed through the incisions or removed through the vagina. RISKS OF HYSTERECTOMY   Bleeding and risk of blood transfusion. Tell your caregiver if you do not want to  receive any blood products.  Blood clots in the legs or  lung.  Infection.  Injury to surrounding organs.  Anesthesia problems or side effects.  Conversion to an abdominal hysterectomy. WHAT TO EXPECT AFTER A HYSTERECTOMY  You will be given pain medicine.  You will need to have someone with you for the first 3 to 5 days after you go home.  You will need to follow up with your surgeon in 2 to 4 weeks after surgery to evaluate your progress.  You may have early menopause symptoms like hot flashes, night sweats, and insomnia.  If you had a hysterectomy for a problem that was not a cancer or a condition that could lead to cancer, then you no longer need Pap tests. However, even if you no longer need a Pap test, a regular exam is a good idea to make sure no other problems are starting. Document Released: 05/19/2001 Document Revised: 02/15/2012 Document Reviewed: 07/04/2011 [facility] Patient Information 2013 [place], [place].

## 2012-09-14 NOTE — Progress Notes (Signed)
History:  46 y.o. [id number] here today for follow up ultrasound results and for endometrial biopsy. Still reports having menorrhagia and occasional pain/cramping. No other symptoms.  The following portions of the patient's history were reviewed and updated as appropriate: allergies, current medications, past family history, past medical history, past social history, past surgical history and problem list. Awaiting report for [facility] about recent pap smear.  Review of Systems:  Pertinent items are noted in HPI.  Objective:  Physical Exam Blood pressure 138/95, pulse 78, height 5\' 3"  (1.6 m), weight 172 lb (78.019 kg), last menstrual period 08/29/2012. Gen: NAD Abd: Soft, nontender and nondistended Pelvic: Normal external female genitalia. Piercing with ring noted through the middle-inferior portion of the right labium majus. Vagina is pink and rugated. Normal discharge. Normal multiparous cervix contour. Uterus is globally enlarged, about 16 week size. No adnexal mass palpated and no adnexal tenderness.   ENDOMETRIAL BIOPSY     The indications for endometrial biopsy were reviewed.   Risks of the biopsy including cramping, bleeding, infection, uterine perforation, inadequate specimen and need for additional procedures  were discussed. The patient states she understands and agrees to undergo procedure today. Consent was signed. Time out was performed. A sterile speculum was placed in the patient's vagina and the cervix was prepped with Betadine. A single-toothed tenaculum was placed on the anterior lip of the cervix to stabilize it. The 3 mm pipelle was introduced into the endometrial cavity without difficulty to a depth of 13 cm, and a moderate amount of tissue was obtained and sent to pathology. The instruments were removed from the patient's vagina. Minimal bleeding from the cervix was noted. The patient tolerated the procedure well. Routine post-procedure instructions were given to the patient.    Labs and Imaging 09/09/2012 TRANSABDOMINAL AND TRANSVAGINAL ULTRASOUND OF PELVIS Clinical Data: Irregular menses with pain and history of fibroids. LMP 08/29/2012   Findings:  Uterus: Is anteverted and anteflexed and demonstrates a sagittal length of 13 cm, depth of 8.4 cm and width of 11.0 cm.  Multiple fibroids are seen with the largest located in the right cornual region measuring 8.4 x 7.8 x 7.4 cm primarily mural with a small subserosal component. Other fibroids are seen in the anterior upper uterine segment with small subserosal component measuring 2.9 x 2.8 x 2.3 cm and in the anterior lower uterine segment measuring 4.0 x 3.5 x 3.5 cm, mural.  Endometrium: Appears thin and echogenic with a width of 5 mm.  No areas of focal thickening or heterogeneity are seen.  Right ovary:  Is not seen endovaginally but has a normal transabdominal appearance with a dominant follicle measuring 3.1 x 2.1 x 1.9 cm  Left ovary: Is not seen endovaginally but has a normal transabdominal appearance measuring 2.5 x 2.2 x 2.2 cm  Other findings: A trace of simple free fluid is noted in the cul-de- sac.  IMPRESSION: Fibroid uterus with fibroid sizes and locations as noted above.  Normal endometrial lining and ovaries.   Original Report Authenticated By: Bertha Stakes, M.D.     Assessment & Plan:  Will follow up results of endometrial biopsy and manage accordingly.  Discussed medical and surgical management of fibroids and menorrhagia.  Patient is leaning towards hysterectomy. She was counseled about preoperative Lupron, she declines at this point. She wants to have her surgery in December 2013; I feel she will benefit from robot assisted hysterectomy with Dr. Marice Potter or Dr. Erin Fulling.  Bleeding and pain precautions reviewed.

## 2012-09-19 ENCOUNTER — Other Ambulatory Visit: Payer: Managed Care, Other (non HMO)

## 2012-09-19 LAB — PATHOLOGY

## 2012-09-20 ENCOUNTER — Ambulatory Visit: Payer: Managed Care, Other (non HMO) | Admitting: Obstetrics & Gynecology

## 2012-09-20 ENCOUNTER — Encounter: Payer: Self-pay | Admitting: Obstetrics & Gynecology

## 2012-09-20 ENCOUNTER — Ambulatory Visit (INDEPENDENT_AMBULATORY_CARE_PROVIDER_SITE_OTHER): Payer: Managed Care, Other (non HMO) | Admitting: Obstetrics & Gynecology

## 2012-09-20 VITALS — BP 140/100 | HR 106 | Ht 63.0 in | Wt 173.0 lb

## 2012-09-20 DIAGNOSIS — D219 Benign neoplasm of connective and other soft tissue, unspecified: Secondary | ICD-10-CM

## 2012-09-20 DIAGNOSIS — D259 Leiomyoma of uterus, unspecified: Secondary | ICD-10-CM

## 2012-09-20 LAB — PAP LB AND HPV HIGH-RISK: PAP Smear Comment: 0

## 2012-09-20 NOTE — Progress Notes (Signed)
  Subjective:    Patient ID: Colleen Haley, female    DOB: 1966-05-03, 46 y.o.   MRN: [id number]  HPI  Colleen Haley is a 46 yo M [place] P2 who is here today to discuss a RATH. She has long standing LLQ pain and dyspareunia. She also complains of menorrhagia. An u/s shows enlarging fibroids (13 cm length of uterus) and a EMBX was negative. She was referred by Dr. Macon Large. Her history is also significant for GSUI for which she has seen a urologist. She initially went to him because of long standing urine loss with intercourse. She describes a very small amount of urge incontinence.  Review of Systems   Her pap and mammogram are normal. She has chronic constipation. She has a BM once per month. Objective:   Physical Exam        Assessment & Plan:  Symptomatic fibroids- She is a good candidate for RATH/cystoscopy/BL salpingectomy. She understands the risks of surgery and wishes. We have had a long conversation about the constipation and the need for enemas/laxatives to have her bowels empty at the time of surgery. We have also discussed a mid urethral sling. She prefers no to have a sling at this time.

## 2012-09-23 ENCOUNTER — Ambulatory Visit: Payer: Managed Care, Other (non HMO) | Admitting: Obstetrics & Gynecology

## 2012-09-26 ENCOUNTER — Encounter: Payer: Managed Care, Other (non HMO) | Admitting: Family Medicine

## 2012-09-29 ENCOUNTER — Ambulatory Visit: Payer: Self-pay | Admitting: Family Medicine

## 2012-09-29 ENCOUNTER — Ambulatory Visit: Payer: Managed Care, Other (non HMO) | Admitting: Obstetrics & Gynecology

## 2012-10-04 ENCOUNTER — Encounter (HOSPITAL_COMMUNITY): Payer: Self-pay | Admitting: Pharmacist

## 2012-10-11 ENCOUNTER — Telehealth: Payer: Self-pay

## 2012-10-11 NOTE — Telephone Encounter (Signed)
Called patients [employer] for Pre-Certification for cpt codes 72536 and [phone] patient did not need any precert for Robotic total hysterectomy spoke to [employer] from [place]. Called at 9:20am on 10/11/12.

## 2012-10-14 ENCOUNTER — Encounter (HOSPITAL_COMMUNITY)
Admission: RE | Admit: 2012-10-14 | Discharge: 2012-10-14 | Disposition: A | Discharge status: Home / Self Care | Payer: Managed Care, Other (non HMO) | Source: Ambulatory Visit | Attending: Obstetrics & Gynecology | Admitting: Obstetrics & Gynecology

## 2012-10-14 ENCOUNTER — Encounter (HOSPITAL_COMMUNITY): Payer: Self-pay

## 2012-10-14 HISTORY — DX: Constipation, unspecified: K59.00

## 2012-10-14 LAB — SURGICAL PCR SCREEN
MRSA, PCR: NEGATIVE
Staphylococcus aureus: NEGATIVE

## 2012-10-14 NOTE — Pre-Procedure Instructions (Addendum)
Pt b/p 147/107 in left arm, recheck 145/102 in right arm, recheck in 10 min in left arm 155/103-notified Dr Monia Sabal known history of hypertension but patient states her b/p is elevated when she goes to dentist or dr appointments. Dr Rodman Pickle wants clearance from PCP-Dr Dayton Martes. Pt called Dr Elmer Sow office from PAT office-will see on Tuesday AM at 0745. -spoke to office-fax number given to send clearance.

## 2012-10-14 NOTE — Patient Instructions (Addendum)
   Your procedure is scheduled on:Wednesday November 13th  Enter through the [employer] of [facility] at:12 noon Pick up the phone at the desk and dial [phone] and inform us of your arrival.  Please call this number if you have any problems the morning of surgery: [phone]  Remember: Do not eat food after midnight Tuesday You may have clear liquids until 9:30 am on Wednesday then nothing   Do not wear jewelry, make-up, or FINGER nail polish No metal in your hair or on your body. Do not wear lotions, powders, perfumes. You may wear deodorant.  Please use your CHG wash as directed prior to surgery.  Do not shave anywhere for at least 12 hours prior to first CHG shower.  Do not bring valuables to the hospital. Contacts may not be worn into surgery.  Leave suitcase in the car. After Surgery it may be brought to your room. For patients being admitted to the hospital, checkout time is 11:00am the day of discharge.

## 2012-10-17 ENCOUNTER — Telehealth: Payer: Self-pay | Admitting: *Deleted

## 2012-10-17 ENCOUNTER — Telehealth: Payer: Self-pay

## 2012-10-17 DIAGNOSIS — K5909 Other constipation: Secondary | ICD-10-CM

## 2012-10-17 DIAGNOSIS — Z01812 Encounter for preprocedural laboratory examination: Secondary | ICD-10-CM

## 2012-10-17 MED ORDER — PEG-KCL-NACL-NASULF-NA ASC-C 100 G PO SOLR: 1.0000 | Freq: Once | ORAL | Status: DC

## 2012-10-17 NOTE — Telephone Encounter (Signed)
CALLED IN Brieana'S MOVIPREP [facility].

## 2012-10-17 NOTE — Telephone Encounter (Signed)
Patient has not used the restroom in 4 days.  She is scheduled for surgery on Wed.  We will call in Moviprep for her to see if this will help her clear her bowels out.

## 2012-10-18 ENCOUNTER — Encounter: Payer: Self-pay | Admitting: Family Medicine

## 2012-10-18 ENCOUNTER — Ambulatory Visit (INDEPENDENT_AMBULATORY_CARE_PROVIDER_SITE_OTHER): Payer: Managed Care, Other (non HMO) | Admitting: Family Medicine

## 2012-10-18 VITALS — BP 130/92 | HR 76 | Temp 97.9°F | Wt 175.0 lb

## 2012-10-18 DIAGNOSIS — Z01818 Encounter for other preprocedural examination: Secondary | ICD-10-CM

## 2012-10-18 LAB — CBC WITH DIFFERENTIAL/PLATELET
Basophils Absolute: 0 10*3/uL (ref 0.0–0.2)
HCT: 36.2 % (ref 34.0–46.6)
Immature Granulocytes: 0 % (ref 0–2)
Lymphocytes Absolute: 1.9 10*3/uL (ref 0.7–3.1)
Lymphs: 21 % (ref 14–46)
MCHC: 32.3 g/dL (ref 31.5–35.7)
Monocytes: 5 % (ref 4–12)
RDW: 14.1 % (ref 12.3–15.4)

## 2012-10-18 NOTE — H&P (Addendum)
Colleen Haley is an 46 y.o. female. She is here today for a RATH due to heavy, "clotty" periods that last 7-8 days per month. Her cycles are shortening, now to q 24 days. She describes dyspareunia for several years. She has so much pain that she cannot even sleep on her right side. She has used IBU for pain relief but only get minimal relief. She also complains of a 20 year history of some urinary incontinence. She describes this as during sexually intercourse, with sneezing, jumping. She wears panty liners daily. She saw the urologist who thought that the fibroids may be contributing. She would like a sling. She does have some urgency issues. She understands that the sling in some cases improves urgency symptoms and in a small percentage of people, worsens urgency symptoms.  Pertinent Gynecological History: Menses: flow is excessive with use of 5 pads or tampons on heaviest days Bleeding: menorrhagia Contraception: BTL DES exposure: denies Blood transfusions: none Sexually transmitted diseases: no past history Previous GYN Procedures: EMBX negative  Last mammogram: normal Date: 2013 Last pap: normal Date: 2013 OB History: G2, P2  Menstrual History: Menarche age: 76 Patient's last menstrual period was 09/15/2012.    Past Medical History  Diagnosis Date  . Positive TB test   . Stress incontinence, female     Followed by Urology  . Constipation     Past Surgical History  Procedure Date  . Tubal ligation 1999    general anesthesia    Family History  Problem Relation Age of Onset  . Hyperlipidemia Mother   . Hypertension Mother   . Hyperlipidemia Father   . Hypertension Father   . Glaucoma Father   . Diabetes Father   . Hyperlipidemia Sister     Social History:  reports that she has never smoked. She has never used smokeless tobacco. She reports that she drinks about .5 ounces of alcohol per week. She reports that she does not use illicit drugs.  Allergies: No Known  Allergies  No prescriptions prior to admission    ROS  Height 5\' 3"  (1.6 m), weight 172 lb (78.019 kg), last menstrual period 09/15/2012. Physical Exam Heart- RRR EKG- normal Lungs- CTAB Abd- benign  No results found for this or any previous visit (from the past 24 hour(s)).  No results found.  Assessment/Plan: Symptomatic fibroids. She would like to have a hysterectomy. 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 She prefers the robotic approach but understands that occasional a conversion to an open case is necessary. She understands the risks of surgery, including, but not to infection, bleeding, DVTs, damage to bowel, bladder, ureters. She wishes to proceed.  Rachana Malesky C. 10/18/2012, 9:09 AM   10/19/12 Update: All questions answered. Consents signed. She did a full bowel prep as one would do for a colonoscopy and did have some liquid stool. Please note that she rarely has a BM (about 1-2 times per month). We have discussed that a laparotomy may be necessary if her bowel is obscuring the operative site.

## 2012-10-18 NOTE — Addendum Note (Signed)
Addended by: Baldomero Lamy on: 10/18/2012 08:21 AM   Modules accepted: Orders

## 2012-10-18 NOTE — Progress Notes (Signed)
Subjective:    Colleen Haley is a 46 y.o. female who presents to the office today for a preoperative consultation at the request of surgeon Dr. Marice Potter who plans on performing robotic assisted hysterectomy on November 13.    Planned anesthesia: general.   Has had previous general anesthesia without issue.  Has not been taking Ibuprofen in past several days.  She is aware to hold this pre operatively.  No CP, SOB, dizziness or blurred vision.  Per pt, Dr. Marice Potter requests a CBC as well.  Patient Active Problem List  Diagnosis  . Routine general medical examination at a health care facility  . Stress incontinence, female  . Positive TB test  . Vitamin D deficiency  . Menorrhagia  . Fibroids  . Pre-op examination   Past Medical History  Diagnosis Date  . Positive TB test   . Stress incontinence, female     Followed by Urology  . Constipation    Past Surgical History  Procedure Date  . Tubal ligation 1999    general anesthesia   History  Substance Use Topics  . Smoking status: Never Smoker   . Smokeless tobacco: Never Used  . Alcohol Use: 0.5 oz/week    1 drink(s) per week     Comment: occasional   Family History  Problem Relation Age of Onset  . Hyperlipidemia Mother   . Hypertension Mother   . Hyperlipidemia Father   . Hypertension Father   . Glaucoma Father   . Diabetes Father   . Hyperlipidemia Sister    No Known Allergies Current Outpatient Prescriptions on File Prior to Visit  Medication Sig Dispense Refill  . ibuprofen (ADVIL,MOTRIN) 800 MG tablet Take 800 mg by mouth daily as needed. For pain or headache      . peg 3350 powder (MOVIPREP) 100 G SOLR Take 1 kit (100 g total) by mouth once.  1 kit  0  . Vitamin D, Ergocalciferol, (DRISDOL) 50000 UNITS CAPS Take 50,000 Units by mouth every 7 (seven) days. Takes on Thursdays       The PMH, PSH, Social History, Family History, Medications, and allergies have been reviewed in [facility], and have been updated if  relevant.    Review of Systems See HPI   Objective:    BP 130/92  Pulse 76  Wt 175 lb (79.379 kg)  LMP 09/28/2012   General:  Well-developed,well-nourished,in no acute distress; alert,appropriate and cooperative throughout examination Head:  normocephalic and atraumatic.   Eyes:  vision grossly intact, pupils equal, pupils round, and pupils reactive to light.   Ears:  R ear normal and L ear normal.   Nose:  no external deformity.   Mouth:  good dentition.   Lungs:  Normal respiratory effort, chest expands symmetrically. Lungs are clear to auscultation, no crackles or wheezes. Heart:  Normal rate and regular rhythm. S1 and S2 normal without gallop, murmur, click, rub or other extra sounds. Msk:  No deformity or scoliosis noted of thoracic or lumbar spine.   Extremities:  No clubbing, cyanosis, edema, or deformity noted with normal full range of motion of all joints.   Neurologic:  alert & oriented X3 and gait normal.   Skin:  Intact without suspicious lesions or rashes CPsych:  Cognition and judgment appear intact. Alert and cooperative with normal attention span and concentration. No apparent delusions, illusions, hallucinations    Cardiographics ECG: normal sinus rhythm, no blocks or conduction defects, no ischemic changes Echocardiogram: not done  Assessment:      46 y.o. female with planned surgery as above.   Known risk factors for perioperative complications: None   Difficulty with intubation is not anticipated.  Cardiac Risk Estimation: low     Plan:    1. Preoperative workup as follows ECG, hemoglobin, hematocrit. Pt is normotensive and ECG reassuring.  Low cardiac risk.

## 2012-10-18 NOTE — Pre-Procedure Instructions (Signed)
Spoke with Patient. Clearance received. In CHL-copy on chart. EKG/cbc done. Low risk for surgery.

## 2012-10-18 NOTE — Patient Instructions (Addendum)
Good to see you. Good luck with your surgery!  We will fax EKG, note and CBC to Dr. Marice Potter.

## 2012-10-19 ENCOUNTER — Ambulatory Visit (HOSPITAL_COMMUNITY): Payer: Managed Care, Other (non HMO) | Admitting: Anesthesiology

## 2012-10-19 ENCOUNTER — Ambulatory Visit (HOSPITAL_COMMUNITY)
Admission: RE | Admit: 2012-10-19 | Discharge: 2012-10-20 | Disposition: A | Discharge status: Home / Self Care | Payer: Managed Care, Other (non HMO) | Source: Ambulatory Visit | Attending: Obstetrics & Gynecology | Admitting: Obstetrics & Gynecology

## 2012-10-19 ENCOUNTER — Encounter (HOSPITAL_COMMUNITY)
Admission: RE | Disposition: A | Discharge status: Home / Self Care | Payer: Self-pay | Source: Ambulatory Visit | Attending: Obstetrics & Gynecology

## 2012-10-19 ENCOUNTER — Encounter (HOSPITAL_COMMUNITY): Payer: Self-pay | Admitting: *Deleted

## 2012-10-19 ENCOUNTER — Encounter (HOSPITAL_COMMUNITY): Payer: Self-pay | Admitting: Anesthesiology

## 2012-10-19 DIAGNOSIS — N803 Endometriosis of pelvic peritoneum, unspecified: Secondary | ICD-10-CM | POA: Insufficient documentation

## 2012-10-19 DIAGNOSIS — N920 Excessive and frequent menstruation with regular cycle: Secondary | ICD-10-CM

## 2012-10-19 DIAGNOSIS — Z01812 Encounter for preprocedural laboratory examination: Secondary | ICD-10-CM | POA: Insufficient documentation

## 2012-10-19 DIAGNOSIS — D219 Benign neoplasm of connective and other soft tissue, unspecified: Secondary | ICD-10-CM

## 2012-10-19 DIAGNOSIS — N393 Stress incontinence (female) (male): Secondary | ICD-10-CM | POA: Insufficient documentation

## 2012-10-19 DIAGNOSIS — D259 Leiomyoma of uterus, unspecified: Secondary | ICD-10-CM | POA: Insufficient documentation

## 2012-10-19 DIAGNOSIS — Z01818 Encounter for other preprocedural examination: Secondary | ICD-10-CM | POA: Insufficient documentation

## 2012-10-19 HISTORY — PX: BILATERAL SALPINGECTOMY: SHX5743

## 2012-10-19 HISTORY — PX: CYSTOSCOPY: SHX5120

## 2012-10-19 HISTORY — PX: BLADDER SUSPENSION: SHX72

## 2012-10-19 HISTORY — PX: ROBOTIC ASSISTED TOTAL HYSTERECTOMY: SHX6085

## 2012-10-19 SURGERY — ROBOTIC ASSISTED TOTAL HYSTERECTOMY
Anesthesia: General | Site: Vagina | Wound class: Clean Contaminated

## 2012-10-19 MED ORDER — DOCUSATE SODIUM 100 MG PO CAPS
100.0000 mg | ORAL_CAPSULE | Freq: Two times a day (BID) | ORAL | Status: DC
  Administered 2012-10-19 – 2012-10-20 (×2): 100 mg via ORAL
  Filled 2012-10-19 (×2): qty 1

## 2012-10-19 MED ORDER — ROCURONIUM BROMIDE 50 MG/5ML IV SOLN
INTRAVENOUS | Status: AC
  Filled 2012-10-19: qty 1

## 2012-10-19 MED ORDER — FENTANYL CITRATE 0.05 MG/ML IJ SOLN
INTRAMUSCULAR | Status: AC
  Administered 2012-10-19: 50 ug via INTRAVENOUS
  Filled 2012-10-19: qty 2

## 2012-10-19 MED ORDER — LIDOCAINE HCL (CARDIAC) 20 MG/ML IV SOLN
INTRAVENOUS | Status: DC | PRN
  Administered 2012-10-19: 50 mg via INTRAVENOUS

## 2012-10-19 MED ORDER — CEFAZOLIN SODIUM-DEXTROSE 2-3 GM-% IV SOLR
INTRAVENOUS | Status: AC
  Filled 2012-10-19: qty 50

## 2012-10-19 MED ORDER — GLYCOPYRROLATE 0.2 MG/ML IJ SOLN
INTRAMUSCULAR | Status: AC
  Filled 2012-10-19: qty 1

## 2012-10-19 MED ORDER — SODIUM CHLORIDE 0.9 % IJ SOLN
INTRAMUSCULAR | Status: DC | PRN
  Administered 2012-10-19: 60 mL

## 2012-10-19 MED ORDER — ROCURONIUM BROMIDE 100 MG/10ML IV SOLN
INTRAVENOUS | Status: DC | PRN
  Administered 2012-10-19: 20 mg via INTRAVENOUS
  Administered 2012-10-19: 50 mg via INTRAVENOUS

## 2012-10-19 MED ORDER — MIDAZOLAM HCL 2 MG/2ML IJ SOLN
INTRAMUSCULAR | Status: AC
  Filled 2012-10-19: qty 2

## 2012-10-19 MED ORDER — FENTANYL CITRATE 0.05 MG/ML IJ SOLN
INTRAMUSCULAR | Status: DC | PRN
  Administered 2012-10-19: 100 ug via INTRAVENOUS
  Administered 2012-10-19: 150 ug via INTRAVENOUS

## 2012-10-19 MED ORDER — ROPIVACAINE HCL 5 MG/ML IJ SOLN
INTRAMUSCULAR | Status: DC | PRN
  Administered 2012-10-19: 60 mL

## 2012-10-19 MED ORDER — LACTATED RINGERS IR SOLN
Status: DC | PRN
  Administered 2012-10-19: 3000 mL

## 2012-10-19 MED ORDER — PROPOFOL 10 MG/ML IV EMUL
INTRAVENOUS | Status: AC
  Filled 2012-10-19: qty 20

## 2012-10-19 MED ORDER — GLYCOPYRROLATE 0.2 MG/ML IJ SOLN
INTRAMUSCULAR | Status: DC | PRN
  Administered 2012-10-19: .4 mg via INTRAVENOUS

## 2012-10-19 MED ORDER — OXYCODONE-ACETAMINOPHEN 5-325 MG PO TABS
1.0000 | ORAL_TABLET | ORAL | Status: DC | PRN
  Administered 2012-10-19 – 2012-10-20 (×2): 2 via ORAL
  Filled 2012-10-19 (×2): qty 2

## 2012-10-19 MED ORDER — PROPOFOL 10 MG/ML IV EMUL
INTRAVENOUS | Status: DC | PRN
  Administered 2012-10-19: 180 mg via INTRAVENOUS

## 2012-10-19 MED ORDER — FENTANYL CITRATE 0.05 MG/ML IJ SOLN
INTRAMUSCULAR | Status: AC
  Filled 2012-10-19: qty 5

## 2012-10-19 MED ORDER — ZOLPIDEM TARTRATE 5 MG PO TABS: 5.0000 mg | ORAL_TABLET | Freq: Every evening | ORAL | Status: DC | PRN

## 2012-10-19 MED ORDER — HYDROMORPHONE HCL PF 1 MG/ML IJ SOLN
0.2000 mg | INTRAMUSCULAR | Status: DC | PRN
  Administered 2012-10-19: 0.6 mg via INTRAVENOUS
  Filled 2012-10-19: qty 1

## 2012-10-19 MED ORDER — HYDROMORPHONE HCL PF 1 MG/ML IJ SOLN
INTRAMUSCULAR | Status: DC | PRN
  Administered 2012-10-19 (×2): 1 mg via INTRAVENOUS

## 2012-10-19 MED ORDER — NEOSTIGMINE METHYLSULFATE 1 MG/ML IJ SOLN
INTRAMUSCULAR | Status: DC | PRN
  Administered 2012-10-19: 3 mg via INTRAVENOUS

## 2012-10-19 MED ORDER — KETOROLAC TROMETHAMINE 30 MG/ML IJ SOLN: 15.0000 mg | Freq: Once | INTRAMUSCULAR | Status: DC | PRN

## 2012-10-19 MED ORDER — STERILE WATER FOR IRRIGATION IR SOLN
Status: DC | PRN
  Administered 2012-10-19: 1000 mL

## 2012-10-19 MED ORDER — NEOSTIGMINE METHYLSULFATE 1 MG/ML IJ SOLN
INTRAMUSCULAR | Status: AC
  Filled 2012-10-19: qty 10

## 2012-10-19 MED ORDER — CEFAZOLIN SODIUM-DEXTROSE 2-3 GM-% IV SOLR
2.0000 g | INTRAVENOUS | Status: AC
  Administered 2012-10-19: 2 g via INTRAVENOUS

## 2012-10-19 MED ORDER — MIDAZOLAM HCL 2 MG/2ML IJ SOLN: 0.5000 mg | Freq: Once | INTRAMUSCULAR | Status: DC | PRN

## 2012-10-19 MED ORDER — HYDROMORPHONE HCL PF 1 MG/ML IJ SOLN
INTRAMUSCULAR | Status: AC
  Filled 2012-10-19: qty 1

## 2012-10-19 MED ORDER — PROMETHAZINE HCL 25 MG/ML IJ SOLN: 6.2500 mg | INTRAMUSCULAR | Status: DC | PRN

## 2012-10-19 MED ORDER — DEXAMETHASONE SODIUM PHOSPHATE 10 MG/ML IJ SOLN
INTRAMUSCULAR | Status: AC
  Filled 2012-10-19: qty 1

## 2012-10-19 MED ORDER — BUPIVACAINE HCL (PF) 0.5 % IJ SOLN
INTRAMUSCULAR | Status: AC
  Filled 2012-10-19: qty 30

## 2012-10-19 MED ORDER — ONDANSETRON HCL 4 MG PO TABS: 4.0000 mg | ORAL_TABLET | Freq: Four times a day (QID) | ORAL | Status: DC | PRN

## 2012-10-19 MED ORDER — ONDANSETRON HCL 4 MG/2ML IJ SOLN: 4.0000 mg | Freq: Four times a day (QID) | INTRAMUSCULAR | Status: DC | PRN

## 2012-10-19 MED ORDER — KETOROLAC TROMETHAMINE 30 MG/ML IJ SOLN
INTRAMUSCULAR | Status: AC
  Filled 2012-10-19: qty 1

## 2012-10-19 MED ORDER — MIDAZOLAM HCL 5 MG/5ML IJ SOLN
INTRAMUSCULAR | Status: DC | PRN
  Administered 2012-10-19: 2 mg via INTRAVENOUS

## 2012-10-19 MED ORDER — LACTATED RINGERS IV SOLN
INTRAVENOUS | Status: DC
  Administered 2012-10-19 (×2): via INTRAVENOUS
  Administered 2012-10-19: 125 mL/h via INTRAVENOUS

## 2012-10-19 MED ORDER — DEXAMETHASONE SODIUM PHOSPHATE 4 MG/ML IJ SOLN
INTRAMUSCULAR | Status: DC | PRN
  Administered 2012-10-19: 8 mg via INTRAVENOUS

## 2012-10-19 MED ORDER — ROPIVACAINE HCL 5 MG/ML IJ SOLN
INTRAMUSCULAR | Status: AC
  Filled 2012-10-19: qty 60

## 2012-10-19 MED ORDER — IBUPROFEN 800 MG PO TABS
800.0000 mg | ORAL_TABLET | Freq: Three times a day (TID) | ORAL | Status: DC | PRN
  Administered 2012-10-19 – 2012-10-20 (×2): 800 mg via ORAL
  Filled 2012-10-19 (×3): qty 1

## 2012-10-19 MED ORDER — OXYCODONE-ACETAMINOPHEN 5-325 MG PO TABS: 1.0000 | ORAL_TABLET | ORAL | Status: DC | PRN

## 2012-10-19 MED ORDER — MEPERIDINE HCL 25 MG/ML IJ SOLN: 6.2500 mg | INTRAMUSCULAR | Status: DC | PRN

## 2012-10-19 MED ORDER — FENTANYL CITRATE 0.05 MG/ML IJ SOLN
25.0000 ug | INTRAMUSCULAR | Status: DC | PRN
  Administered 2012-10-19 (×3): 50 ug via INTRAVENOUS

## 2012-10-19 MED ORDER — INDIGOTINDISULFONATE SODIUM 8 MG/ML IJ SOLN
INTRAMUSCULAR | Status: DC | PRN
  Administered 2012-10-19: 3 mL via INTRAVENOUS

## 2012-10-19 MED ORDER — LIDOCAINE HCL (CARDIAC) 20 MG/ML IV SOLN
INTRAVENOUS | Status: AC
  Filled 2012-10-19: qty 5

## 2012-10-19 MED ORDER — KETOROLAC TROMETHAMINE 30 MG/ML IJ SOLN
INTRAMUSCULAR | Status: DC | PRN
  Administered 2012-10-19: 30 mg via INTRAVENOUS

## 2012-10-19 MED ORDER — ONDANSETRON HCL 4 MG/2ML IJ SOLN
INTRAMUSCULAR | Status: DC | PRN
  Administered 2012-10-19: 4 mg via INTRAVENOUS

## 2012-10-19 SURGICAL SUPPLY — 80 items
APPLICATOR COTTON TIP 6IN STRL (MISCELLANEOUS) ×12 IMPLANT
BAG URINE DRAINAGE (UROLOGICAL SUPPLIES) ×4 IMPLANT
BARRIER ADHS 3X4 INTERCEED (GAUZE/BANDAGES/DRESSINGS) IMPLANT
BENZOIN TINCTURE PRP APPL 2/3 (GAUZE/BANDAGES/DRESSINGS) ×4 IMPLANT
BLADE LAPAROSCOPIC MORCELL KIT (BLADE) IMPLANT
BLADE SURG 10 STRL SS (BLADE) ×12 IMPLANT
BLADE SURG 11 STRL SS (BLADE) ×12 IMPLANT
CABLE HIGH FREQUENCY MONO STRZ (ELECTRODE) ×4 IMPLANT
CATH FOLEY 2WAY SLVR  5CC 18FR (CATHETERS) ×1
CATH FOLEY 2WAY SLVR 5CC 18FR (CATHETERS) ×3 IMPLANT
CATH FOLEY 3WAY  5CC 16FR (CATHETERS) ×1
CATH FOLEY 3WAY 5CC 16FR (CATHETERS) ×3 IMPLANT
CLOTH BEACON ORANGE TIMEOUT ST (SAFETY) ×4 IMPLANT
CONT PATH 16OZ SNAP LID 3702 (MISCELLANEOUS) ×4 IMPLANT
COVER MAYO STAND STRL (DRAPES) ×4 IMPLANT
COVER TABLE BACK 60X90 (DRAPES) ×8 IMPLANT
COVER TIP SHEARS 8 DVNC (MISCELLANEOUS) ×3 IMPLANT
COVER TIP SHEARS 8MM DA VINCI (MISCELLANEOUS) ×1
DECANTER SPIKE VIAL GLASS SM (MISCELLANEOUS) ×4 IMPLANT
DERMABOND ADVANCED (GAUZE/BANDAGES/DRESSINGS) ×2
DERMABOND ADVANCED .7 DNX12 (GAUZE/BANDAGES/DRESSINGS) ×6 IMPLANT
DEVICE TROCAR PUNCTURE CLOSURE (ENDOMECHANICALS) IMPLANT
DILATOR CANAL MILEX (MISCELLANEOUS) ×4 IMPLANT
DRAPE HUG U DISPOSABLE (DRAPE) ×4 IMPLANT
DRAPE LG THREE QUARTER DISP (DRAPES) ×8 IMPLANT
DRAPE WARM FLUID 44X44 (DRAPE) ×4 IMPLANT
ELECT REM PT RETURN 9FT ADLT (ELECTROSURGICAL) ×4
ELECTRODE REM PT RTRN 9FT ADLT (ELECTROSURGICAL) ×3 IMPLANT
EVACUATOR SMOKE 8.L (FILTER) ×8 IMPLANT
GAUZE VASELINE 3X9 (GAUZE/BANDAGES/DRESSINGS) IMPLANT
GLOVE BIO SURGEON STRL SZ 6.5 (GLOVE) ×12 IMPLANT
GLOVE BIOGEL PI IND STRL 6.5 (GLOVE) ×12 IMPLANT
GLOVE BIOGEL PI IND STRL 7.0 (GLOVE) ×15 IMPLANT
GLOVE BIOGEL PI INDICATOR 6.5 (GLOVE) ×4
GLOVE BIOGEL PI INDICATOR 7.0 (GLOVE) ×5
GLOVE ECLIPSE 6.0 STRL STRAW (GLOVE) ×20 IMPLANT
GLOVE ECLIPSE 6.5 STRL STRAW (GLOVE) ×16 IMPLANT
GOWN STRL REIN XL XLG (GOWN DISPOSABLE) ×48 IMPLANT
KIT ACCESSORY DA VINCI DISP (KITS) ×1
KIT ACCESSORY DVNC DISP (KITS) ×3 IMPLANT
LEGGING LITHOTOMY PAIR STRL (DRAPES) ×4 IMPLANT
MANIPULATOR UTERINE 4.5 ZUMI (MISCELLANEOUS) IMPLANT
NEEDLE INSUFFLATION 14GA 120MM (NEEDLE) ×4 IMPLANT
NEEDLE SPNL 18GX3.5 QUINCKE PK (NEEDLE) ×4 IMPLANT
OCCLUDER COLPOPNEUMO (BALLOONS) ×4 IMPLANT
PACK LAVH (CUSTOM PROCEDURE TRAY) ×4 IMPLANT
PAD PREP 24X48 CUFFED NSTRL (MISCELLANEOUS) ×8 IMPLANT
PLUG CATH AND CAP STER (CATHETERS) ×4 IMPLANT
PROTECTOR NERVE ULNAR (MISCELLANEOUS) ×8 IMPLANT
SET CYSTO W/LG BORE CLAMP LF (SET/KITS/TRAYS/PACK) ×4 IMPLANT
SET IRRIG TUBING LAPAROSCOPIC (IRRIGATION / IRRIGATOR) ×4 IMPLANT
SLING ADVANTAGE TRANSVAGINAL (Sling) ×4 IMPLANT
SOLUTION ELECTROLUBE (MISCELLANEOUS) ×4 IMPLANT
STRIP CLOSURE SKIN 1/2X4 (GAUZE/BANDAGES/DRESSINGS) ×4 IMPLANT
SUT VIC AB 0 CT1 27 (SUTURE) ×2
SUT VIC AB 0 CT1 27XBRD ANBCTR (SUTURE) ×6 IMPLANT
SUT VIC AB 0 CT1 27XBRD ANTBC (SUTURE) IMPLANT
SUT VIC AB 2-0 SH 27 (SUTURE) ×1
SUT VIC AB 2-0 SH 27XBRD (SUTURE) ×3 IMPLANT
SUT VIC AB 4-0 SH 27 (SUTURE) ×1
SUT VIC AB 4-0 SH 27XANBCTRL (SUTURE) ×3 IMPLANT
SUT VICRYL 0 UR6 27IN ABS (SUTURE) ×8 IMPLANT
SUT VICRYL RAPIDE 4/0 PS 2 (SUTURE) ×8 IMPLANT
SUT VLOC 180 0 9IN  GS21 (SUTURE)
SUT VLOC 180 0 9IN GS21 (SUTURE) IMPLANT
SYR 30ML LL (SYRINGE) ×4 IMPLANT
SYR 50ML LL SCALE MARK (SYRINGE) ×4 IMPLANT
SYSTEM CONVERTIBLE TROCAR (TROCAR) IMPLANT
TIP RUMI ORANGE 6.7MMX12CM (TIP) ×4 IMPLANT
TIP UTERINE 5.1X6CM LAV DISP (MISCELLANEOUS) IMPLANT
TIP UTERINE 6.7X10CM GRN DISP (MISCELLANEOUS) IMPLANT
TIP UTERINE 6.7X6CM WHT DISP (MISCELLANEOUS) IMPLANT
TIP UTERINE 6.7X8CM BLUE DISP (MISCELLANEOUS) IMPLANT
TOWEL OR 17X24 6PK STRL BLUE (TOWEL DISPOSABLE) ×8 IMPLANT
TROCAR DISP BLADELESS 8 DVNC (TROCAR) ×3 IMPLANT
TROCAR DISP BLADELESS 8MM (TROCAR) ×1
TROCAR XCEL 12X100 BLDLESS (ENDOMECHANICALS) IMPLANT
TROCAR Z-THREAD 12X150 (TROCAR) ×4 IMPLANT
TUBING FILTER THERMOFLATOR (ELECTROSURGICAL) ×4 IMPLANT
WATER STERILE IRR 1000ML POUR (IV SOLUTION) ×12 IMPLANT

## 2012-10-19 NOTE — Addendum Note (Signed)
Addendum  created 10/19/12 1646 by Quandarius Nill S Artice Holohan, CRNA   Modules edited:Anesthesia Medication Administration    

## 2012-10-19 NOTE — Addendum Note (Signed)
Addendum  created 10/19/12 1646 by Elbert Ewings, CRNA   Modules edited:Anesthesia Medication Administration

## 2012-10-19 NOTE — Op Note (Signed)
10/19/2012  3:47 PM  PATIENT:  Colleen Haley  46 y.o. female  PRE-OPERATIVE DIAGNOSIS:  symptomatic fibroids, stress incontinence  POST-OPERATIVE DIAGNOSIS:  symptomatic fibroids, stress incontinence and endometriosis  PROCEDURE:  Procedure(s) (LRB) with comments: ROBOTIC ASSISTED TOTAL HYSTERECTOMY (N/A) BILATERAL SALPINGECTOMY (N/A) TRANSVAGINAL TAPE (TVT) PROCEDURE (N/A) CYSTOSCOPY (N/A)  SURGEON:  Surgeon(s) and Role:    * Allie Bossier, MD - Primary     PHYSICIAN ASSISTANT:   ASSISTANTS: Tinnie Gens, MD   ANESTHESIA:   general  EBL:  Total I/O In: 1800 [I.V.:1800] Out: 500 [Urine:300; Blood:200]  BLOOD ADMINISTERED:none  DRAINS: none   LOCAL MEDICATIONS USED:  OTHER ropivicaine  SPECIMEN:  Source of Specimen:  uterus, oviducts  DISPOSITION OF SPECIMEN:  PATHOLOGY  COUNTS:  YES  TOURNIQUET:  * No tourniquets in log *  DICTATION: .Dragon Dictation  PLAN OF CARE: Outpatient with extended recovery  PATIENT DISPOSITION:  PACU - hemodynamically stable.   Delay start of Pharmacological VTE agent (>24hrs) due to surgical blood loss or risk of bleeding: not applicable  The risks, benefits, and alternatives of surgery were explained, understood, and accepted. Consents were signed. All questions were answered. She was taken to the operating room and general anesthesia was applied without complication. She was placed in the dorsal lithotomy position and her abdomen and vagina were prepped and draped after she had been carefully positioned on the table. A bimanual exam revealed a 12 week size uterus that was mobile. Her adnexa were not enlarged. The cervix was measured and the uterus was sounded to 13 cm. A Rumi uterine manipulator was placed without difficulty. A Foley catheter was placed and it drained clear throughout the case. Gloves were changed and attention was turned to the abdomen. A vertical supra- umbilical incision was made and a Veress needle was placed  intraperitoneally. CO2 was used to insufflate the abdomen to approximately 6 L. After good pneumoperitoneum was established, a 12 mm trocar was placed. Laparoscopy confirmed correct placement. She was placed in Trendelenburg position and ports were placed in appropriate positions on her abdomen to allow maximum exposure during the robotic case. Specifically there was an 12 mm assistant port placed in the right mid quadrant under direct laproscopic visualization. A 8 mm port was placed about 10 cm lateral to the umbilical port on the left and an 8 mm port in the right lower quadrant under direct laparoscopic visualization. Prior to all incisions, each site was injected with ropivicaine. Dilute ropivicaine was also placed in the pelvis. The robot was attached to the ports and I proceeded with a robotic portion of the case. The pelvis was inspected in an alpha manner. There was endometriosis noted on the left ovary and on the left uterosacral ligament. There were several large fibroids noted. The remainder of her pelvis appeared normal. The ureters and the infundibulopelvic ligaments were identified. The utero ovarian ligament on each side was cauterized and cut. The PK/gyrus instrument was used for this portion. The round ligaments were identified, cauterized and ligated.a bladder flap was created anteriorly. The uterine vessels were identified and cauterized and then cut.The bladder was pushed out of the operative site and an anterior colpotomy was made. The colpotomy incision was extended circumferentially, following the blue outline of the Rumi manipulator. All pedicles were hemostatic. The 576 gram uterus was removed through the vagina. The vaginal cuff was closed with 0 Vicryl V lock suture. Excellent hemostasis was noted throughout. The pelvis was irrigated. Through the laparoscope I was  able to see the ureters functioning normally and appearing of normal caliber The robot was undocked and the assistant port  site was closed with a 0 vicryl stitch using the laparoscopic fascial closure device. The fascia at the umbilicus was elevated and closed in a figure-of-eight fashion with a 0 Vicryl suture. The skin incisions were closed with 4-0 vicryl and dermabond. I then proceeded with the TVT. I injected dilute ropivicaine in the space behind the pubic bone and in the anterior vaginal mucosa. I made a small incision in the vaginal mucosa about 1.5 cm below the urethra. I placed the TVT Exact device appropriately. The patient was given indigo carmine. I preformed cystoscopy and noted blue dye from each ureter. The bladder was intact with no suture to TVT device present. She was extubated and taken to recovery in stable condition.

## 2012-10-19 NOTE — Anesthesia Postprocedure Evaluation (Signed)
Anesthesia Post Note  Patient: Colleen Haley  Procedure(s) Performed: Procedure(s) (LRB): ROBOTIC ASSISTED TOTAL HYSTERECTOMY (N/A) BILATERAL SALPINGECTOMY (N/A) TRANSVAGINAL TAPE (TVT) PROCEDURE (N/A) CYSTOSCOPY (N/A)  Anesthesia type: GA  Patient location: PACU  Post pain: Pain level controlled  Post assessment: Post-op Vital signs reviewed  Last Vitals:  Filed Vitals:   10/19/12 1600  BP: 137/93  Pulse: 85  Temp:   Resp: 22    Post vital signs: Reviewed  Level of consciousness: sedated  Complications: No apparent anesthesia complications

## 2012-10-19 NOTE — Transfer of Care (Signed)
Immediate Anesthesia Transfer of Care Note  Patient: Colleen Haley  Procedure(s) Performed: Procedure(s) (LRB) with comments: ROBOTIC ASSISTED TOTAL HYSTERECTOMY (N/A) BILATERAL SALPINGECTOMY (N/A) TRANSVAGINAL TAPE (TVT) PROCEDURE (N/A) CYSTOSCOPY (N/A)  Patient Location: PACU  Anesthesia Type:General  Level of Consciousness: awake, alert  and oriented  Airway & Oxygen Therapy: Patient Spontanous Breathing and Patient connected to nasal cannula oxygen  Post-op Assessment: Report given to PACU RN and Post -op Vital signs reviewed and stable  Post vital signs: stable  Complications: No apparent anesthesia complications

## 2012-10-19 NOTE — Anesthesia Preprocedure Evaluation (Signed)
Anesthesia Evaluation  Patient identified by MRN, date of birth, ID band Patient awake    Reviewed: Allergy & Precautions, H&P , NPO status , Patient's Chart, lab work & pertinent test results  Airway Mallampati: I TM Distance: >3 FB Neck ROM: full    Dental  (+) Teeth Intact and Chipped   Pulmonary neg pulmonary ROS,    Pulmonary exam normal       Cardiovascular negative cardio ROS      Neuro/Psych negative neurological ROS  negative psych ROS   GI/Hepatic negative GI ROS, Neg liver ROS,   Endo/Other  negative endocrine ROS  Renal/GU negative Renal ROS  negative genitourinary   Musculoskeletal negative musculoskeletal ROS (+)   Abdominal Normal abdominal exam  (+)   Peds negative pediatric ROS (+)  Hematology negative hematology ROS (+)   Anesthesia Other Findings   Reproductive/Obstetrics negative OB ROS                           Anesthesia Physical Anesthesia Plan  ASA: I  Anesthesia Plan: General   Post-op Pain Management:    Induction: Intravenous  Airway Management Planned: Oral ETT  Additional Equipment:   Intra-op Plan:   Post-operative Plan: Extubation in OR  Informed Consent: I have reviewed the patients History and Physical, chart, labs and discussed the procedure including the risks, benefits and alternatives for the proposed anesthesia with the patient or authorized representative who has indicated his/her understanding and acceptance.   Dental Advisory Given  Plan Discussed with: CRNA and Surgeon  Anesthesia Plan Comments:         Anesthesia Quick Evaluation

## 2012-10-20 ENCOUNTER — Encounter (HOSPITAL_COMMUNITY): Payer: Self-pay | Admitting: Obstetrics & Gynecology

## 2012-10-20 MED ORDER — FLAVOXATE HCL 100 MG PO TABS: 100.0000 mg | ORAL_TABLET | Freq: Three times a day (TID) | ORAL | Status: DC | PRN

## 2012-10-20 NOTE — Discharge Summary (Signed)
Physician Discharge Summary  Patient ID: Colleen Haley MRN: [id number] DOB/AGE: [birth year] 46 y.o.  Admit date: 10/19/2012 Discharge date: 10/20/2012  Admission Diagnoses: symptomatic fibroids, stress incontinence  Discharge Diagnoses: same Active Problems:  * No active hospital problems. *    Discharged Condition: good  Hospital Course: She underwent a RATH/bilateral salpingectomy/cystoscopy/TVT without complication. She was unable to empty her bladder on POD #0 so she decided to stay overnight in the hospital. She was voiding on POD #1 but still had 600 cc as a PVR so I sent her home with a leg bag foley. She will have a voiding trial at the [facility] office tomorrow at noon. She was tolerating po and ambulating without difficulty and voiced her desire to go home.  Consults: None  Significant Diagnostic Studies: none  Treatments: surgery: as above  Discharge Exam: Blood pressure 112/71, pulse 91, temperature 97.8 F (36.6 C), temperature source Oral, resp. rate 18, height 5\' 3"  (1.6 m), weight 79.379 kg (175 lb), last menstrual period 09/10/2012, SpO2 98.00%. General appearance: alert Resp: clear to auscultation bilaterally Cardio: regular rate and rhythm, S1, S2 normal, no murmur, click, rub or gallop GI: soft, non-tender; bowel sounds normal; no masses,  no organomegaly Incision/Wound:c/d/i/no erythema  Disposition: Final discharge disposition not confirmed     Medication List     As of 10/20/2012  9:56 AM    STOP taking these medications         peg 3350 powder 100 G Solr   Commonly known as: MOVIPREP      TAKE these medications         flavoxATE 100 MG tablet   Commonly known as: URISPAS   Take 1 tablet (100 mg total) by mouth 3 (three) times daily as needed (bladder spasm).      ibuprofen 800 MG tablet   Commonly known as: ADVIL,MOTRIN   Take 800 mg by mouth daily as needed. For pain or headache      oxyCODONE-acetaminophen 5-325 MG per tablet   Commonly known as: PERCOCET/ROXICET   Take 1 tablet by mouth every 4 (four) hours as needed for pain.      Vitamin D (Ergocalciferol) 50000 UNITS Caps   Commonly known as: DRISDOL   Take 50,000 Units by mouth every 7 (seven) days. Takes on Thursdays           Follow-up Information    Follow up with Kemon Devincenzi C., MD. Schedule an appointment as soon as possible for a visit in 6 weeks. (voiding trial tomorrow at [facility])    Contact information:   [address] [place] [id number]         Signed: Allie Bossier. 10/20/2012, 9:56 AM

## 2012-10-20 NOTE — Anesthesia Postprocedure Evaluation (Signed)
  Anesthesia Post-op Note  Patient: Colleen Haley  Procedure(s) Performed: Procedure(s) (LRB) with comments: ROBOTIC ASSISTED TOTAL HYSTERECTOMY (N/A) BILATERAL SALPINGECTOMY (N/A) TRANSVAGINAL TAPE (TVT) PROCEDURE (N/A) CYSTOSCOPY (N/A)  Patient Location: [facility]  Anesthesia Type:General  Level of Consciousness: awake and alert   Airway and Oxygen Therapy: Patient Spontanous Breathing  Post-op Pain: none  Post-op Assessment: Patient's Cardiovascular Status Stable, Respiratory Function Stable, Patent Airway, No signs of Nausea or vomiting, Adequate PO intake and Pain level controlled  Post-op Vital Signs: Reviewed and stable  Complications: No apparent anesthesia complications

## 2012-10-20 NOTE — Addendum Note (Signed)
Addendum  created 10/20/12 0806 by Suella Grove, CRNA   Modules edited:Notes Section

## 2012-10-28 ENCOUNTER — Encounter: Payer: Self-pay | Admitting: Obstetrics & Gynecology

## 2012-10-28 ENCOUNTER — Ambulatory Visit (INDEPENDENT_AMBULATORY_CARE_PROVIDER_SITE_OTHER): Payer: Managed Care, Other (non HMO) | Admitting: Obstetrics & Gynecology

## 2012-10-28 ENCOUNTER — Encounter: Payer: Self-pay | Admitting: *Deleted

## 2012-10-28 VITALS — BP 145/92 | HR 90 | Ht 63.0 in | Wt 176.0 lb

## 2012-10-28 DIAGNOSIS — Z9889 Other specified postprocedural states: Secondary | ICD-10-CM

## 2012-10-28 DIAGNOSIS — Z09 Encounter for follow-up examination after completed treatment for conditions other than malignant neoplasm: Secondary | ICD-10-CM

## 2012-10-28 NOTE — Progress Notes (Signed)
  Subjective:    Patient ID: Colleen Haley, female    DOB: Dec 29, 1965, 46 y.o.   MRN: [id number]  HPI  Colleen Haley is here for a 2 week po check. She will be returning to work here in a week. She reports complete resolution of her stress incontinence. She reports much improved bowel habits since surgery. She has not had sex and intends to wait another month at least.  Review of Systems     Objective:   Physical Exam  Incisions healing well Vaginal mucosa at site of TVT healing well      Assessment & Plan:  Post op - doing well RTC 1 month.

## 2012-11-15 ENCOUNTER — Telehealth: Payer: Self-pay | Admitting: *Deleted

## 2012-11-15 MED ORDER — IBUPROFEN 800 MG PO TABS: 800.0000 mg | ORAL_TABLET | Freq: Every day | ORAL | Status: DC | PRN

## 2012-11-15 NOTE — Telephone Encounter (Signed)
Medication ibuprofen sent to patients mail order pharmacy.

## 2012-12-02 ENCOUNTER — Ambulatory Visit (INDEPENDENT_AMBULATORY_CARE_PROVIDER_SITE_OTHER): Payer: Managed Care, Other (non HMO) | Admitting: Obstetrics & Gynecology

## 2012-12-02 ENCOUNTER — Encounter: Payer: Self-pay | Admitting: Obstetrics & Gynecology

## 2012-12-02 VITALS — BP 135/90 | HR 89 | Ht 63.0 in | Wt 179.6 lb

## 2012-12-02 DIAGNOSIS — Z09 Encounter for follow-up examination after completed treatment for conditions other than malignant neoplasm: Secondary | ICD-10-CM

## 2012-12-02 NOTE — Addendum Note (Signed)
Addended by: Barbara Cower on: 12/02/2012 12:10 PM   Modules accepted: Orders

## 2012-12-02 NOTE — Progress Notes (Signed)
  Subjective:    Patient ID: Colleen Haley, female    DOB: 22-Jan-1966, 46 y.o.   MRN: [id number]  HPI Colleen Haley is here for her 6 week po visit. She had sex several days ago without pain although her husband did feel her stitches. Her bowels are much more regular QD - QOD with frequent Colace. She has no complaints and has been back at work for several weeks.   Review of Systems     Objective:   Physical Exam  Well healed incisions and vaginal cuff (stitches visible) Normal bimanual exam      Assessment & Plan:   Post op doing well RTC 1 year/prn sooner I did recommend a GI consult because of her need for Colace.

## 2012-12-19 ENCOUNTER — Other Ambulatory Visit (INDEPENDENT_AMBULATORY_CARE_PROVIDER_SITE_OTHER): Payer: Managed Care, Other (non HMO) | Admitting: *Deleted

## 2012-12-19 DIAGNOSIS — E559 Vitamin D deficiency, unspecified: Secondary | ICD-10-CM

## 2012-12-20 LAB — VITAMIN D 25 HYDROXY (VIT D DEFICIENCY, FRACTURES): Vit D, 25-Hydroxy: 32.7 ng/mL (ref 30.0–100.0)

## 2013-01-10 ENCOUNTER — Encounter: Payer: Self-pay | Admitting: Obstetrics & Gynecology

## 2013-01-10 ENCOUNTER — Ambulatory Visit (INDEPENDENT_AMBULATORY_CARE_PROVIDER_SITE_OTHER): Payer: Managed Care, Other (non HMO) | Admitting: Obstetrics & Gynecology

## 2013-01-10 DIAGNOSIS — N930 Postcoital and contact bleeding: Secondary | ICD-10-CM

## 2013-01-10 NOTE — Addendum Note (Signed)
Addended by: Barbara Cower on: 01/10/2013 08:55 AM   Modules accepted: Orders

## 2013-01-10 NOTE — Progress Notes (Signed)
  Subjective:    Patient ID: Colleen Haley, female    DOB: 06-06-1966, 47 y.o.   MRN: [id number]  HPI Colleen Haley is here today because when she had sex 3 days ago she had some bleeding and excruciating pain. Her pain and bleeding have resolved.  Review of Systems    She has long standing (years) of chronic constipation and has not yet had her colonoscopy as recommended months ago. Objective:   Physical Exam   I tried 5 different speculums, including the one with side walls, and I was unable to visualize the cuff adequately. Briefly, I was able to see some granulation tissue at the left edge of the cuff and a small piece of suture at the right edge. She was unable to tolerate the exam due to the pain, especially when the speculum pushed posteriorly towards the rectum.     Assessment & Plan:  Post coital bleeding- I will need to have a different speculum (longer and wider) to treat the granulation tissue Pain- I will order an u/s to make sure that there are no ovarian cysts present.  She was offered an EUA but she prefers to make another attempt at an in office examination with a different speculum.

## 2013-01-13 ENCOUNTER — Ambulatory Visit (HOSPITAL_COMMUNITY)
Admission: RE | Admit: 2013-01-13 | Discharge: 2013-01-13 | Disposition: A | Discharge status: Home / Self Care | Payer: Managed Care, Other (non HMO) | Source: Ambulatory Visit | Attending: Obstetrics & Gynecology | Admitting: Obstetrics & Gynecology

## 2013-01-13 DIAGNOSIS — G8918 Other acute postprocedural pain: Secondary | ICD-10-CM | POA: Insufficient documentation

## 2013-01-13 DIAGNOSIS — N83209 Unspecified ovarian cyst, unspecified side: Secondary | ICD-10-CM | POA: Insufficient documentation

## 2013-01-13 DIAGNOSIS — N930 Postcoital and contact bleeding: Secondary | ICD-10-CM | POA: Insufficient documentation

## 2013-01-19 ENCOUNTER — Encounter: Payer: Managed Care, Other (non HMO) | Admitting: Obstetrics & Gynecology

## 2013-01-24 ENCOUNTER — Encounter: Payer: Self-pay | Admitting: Family Medicine

## 2013-01-24 ENCOUNTER — Ambulatory Visit (INDEPENDENT_AMBULATORY_CARE_PROVIDER_SITE_OTHER): Payer: Managed Care, Other (non HMO) | Admitting: Family Medicine

## 2013-01-24 DIAGNOSIS — N898 Other specified noninflammatory disorders of vagina: Secondary | ICD-10-CM | POA: Insufficient documentation

## 2013-01-24 DIAGNOSIS — N899 Noninflammatory disorder of vagina, unspecified: Secondary | ICD-10-CM

## 2013-01-24 DIAGNOSIS — R300 Dysuria: Secondary | ICD-10-CM

## 2013-01-24 DIAGNOSIS — N949 Unspecified condition associated with female genital organs and menstrual cycle: Secondary | ICD-10-CM

## 2013-01-24 DIAGNOSIS — R102 Pelvic and perineal pain: Secondary | ICD-10-CM | POA: Insufficient documentation

## 2013-01-24 DIAGNOSIS — N83209 Unspecified ovarian cyst, unspecified side: Secondary | ICD-10-CM

## 2013-01-24 MED ORDER — CIPROFLOXACIN HCL 500 MG PO TABS: 500.0000 mg | ORAL_TABLET | Freq: Two times a day (BID) | ORAL | Status: DC

## 2013-01-24 NOTE — Patient Instructions (Addendum)
Dysuria Dysuria is the medical term for pain with urination. There are many causes for dysuria, but urinary tract infection is the most common. If a urinalysis was performed it can show that there is a urinary tract infection. A urine culture confirms that you or your child is sick. You will need to follow up with a healthcare provider because:  If a urine culture was done you will need to know the culture results and treatment recommendations.  If the urine culture was positive, you or your child will need to be put on antibiotics or know if the antibiotics prescribed are the right antibiotics for your urinary tract infection.  If the urine culture is negative (no urinary tract infection), then other causes may need to be explored or antibiotics need to be stopped. Today laboratory work may have been done and there does not seem to be an infection. If cultures were done they will take at least 24 to 48 hours to be completed. Today x-rays may have been taken and they read as normal. No cause can be found for the problems. The x-rays may be re-read by a radiologist and you will be contacted if additional findings are made. You or your child may have been put on medications to help with this problem until you can see your primary caregiver. If the problems get better, see your primary caregiver if the problems return. If you were given antibiotics (medications which kill germs), take all of the mediations as directed for the full course of treatment.  If laboratory work was done, you need to find the results. Leave a telephone number where you can be reached. If this is not possible, make sure you find out how you are to get test results. HOME CARE INSTRUCTIONS   Drink lots of fluids. For adults, drink eight, 8 ounce glasses of clear juice or water a day. For children, replace fluids as suggested by your caregiver.  Empty the bladder often. Avoid holding urine for long periods of time.  After a bowel  movement, women should cleanse front to back, using each tissue only once.  Empty your bladder before and after sexual intercourse.  Take all the medicine given to you until it is gone. You may feel better in a few days, but TAKE ALL MEDICINE.  Avoid caffeine, tea, alcohol and carbonated beverages, because they tend to irritate the bladder.  In men, alcohol may irritate the prostate.  Only take over-the-counter or prescription medicines for pain, discomfort, or fever as directed by your caregiver.  If your caregiver has given you a follow-up appointment, it is very important to keep that appointment. Not keeping the appointment could result in a chronic or permanent injury, pain, and disability. If there is any problem keeping the appointment, you must call back to this facility for assistance. SEEK IMMEDIATE MEDICAL CARE IF:   Back pain develops.  A fever develops.  There is nausea (feeling sick to your stomach) or vomiting (throwing up).  Problems are no better with medications or are getting worse. MAKE SURE YOU:   Understand these instructions.  Will watch your condition.  Will get help right away if you are not doing well or get worse. Document Released: 08/21/2004 Document Revised: 02/15/2012 Document Reviewed: 06/28/2008 [facility] Patient Information 2013 [place], [place]. Pelvic Pain Pelvic pain is pain below the belly button and located between your hips. Acute pain may last a few hours or days. Chronic pelvic pain may last weeks and months. The  cause may be different for different types of pain. The pain may be dull or sharp, mild or severe and can interfere with your daily activities. Write down and tell your caregiver:   Exactly where the pain is located.  If it comes and goes or is there all the time.  When it happens (with sex, urination, bowel movement, etc.)  If the pain is related to your menstrual period or stress. Your caregiver will take a full history and  do a complete physical exam and Pap test. CAUSES   Painful menstrual periods (dysmenorrhea).  Normal ovulation (Mittelschmertz) that occurs in the middle of the menstrual cycle every month.  The pelvic organs get engorged with blood just before the menstrual period (pelvic congestive syndrome).  Scar tissue from an infection or past surgery (pelvic adhesions).  Cancer of the female pelvic organs. When there is pain with cancer, it has been there for a long time.  The lining of the uterus (endometrium) abnormally grows in places like the pelvis and on the pelvic organs (endometriosis).  A form of endometriosis with the lining of the uterus present inside of the muscle tissue of the uterus (adenomyosis).  Fibroid tumor (noncancerous) in the uterus.  Bladder problems such as infection, bladder spasms of the muscle tissue of the bladder.  Intestinal problems (irritable bowel syndrome, colitis, an ulcer or gastrointestinal infection).  Polyps of the cervix or uterus.  Pregnancy in the tube (ectopic pregnancy).  The opening of the cervix is too small for the menstrual blood to flow through it (cervical stenosis).  Physical or sexual abuse (past or present).  Musculo-skeletal problems from poor posture, problems with the vertebrae of the lower back or the uterine pelvic muscles falling (prolapse).  Psychological problems such as depression or stress.  IUD (intrauterine device) in the uterus. DIAGNOSIS  Tests to make a diagnosis depends on the type, location, severity and what causes the pain to occur. Tests that may be needed include:  Blood tests.  Urine tests  Ultrasound.  X-rays.  CT Scan.  MRI.  Laparoscopy.  Major surgery. TREATMENT  Treatment will depend on the cause of the pain, which includes:  Prescription or over-the-counter pain medication.  Antibiotics.  Birth control pills.  Hormone treatment.  Nerve blocking injections.  Physical  therapy.  Antidepressants.  Counseling with a psychiatrist or psychologist.  Minor or major surgery. HOME CARE INSTRUCTIONS   Only take over-the-counter or prescription medicines for pain, discomfort or fever as directed by your caregiver.  Follow your caregiver's advice to treat your pain.  Rest.  Avoid sexual intercourse if it causes the pain.  Apply warm or cold compresses (which ever works best) to the pain area.  Do relaxation exercises such as yoga or meditation.  Try acupuncture.  Avoid stressful situations.  Try group therapy.  If the pain is because of a stomach/intestinal upset, drink clear liquids, eat a bland light food diet until the symptoms go away. SEEK MEDICAL CARE IF:   You need stronger prescription pain medication.  You develop pain with sexual intercourse.  You have pain with urination.  You develop a temperature of 102 F (38.9 C) with the pain.  You are still in pain after 4 hours of taking prescription medication for the pain.  You need depression medication.  Your IUD is causing pain and you want it removed. SEEK IMMEDIATE MEDICAL CARE IF:  You develop very severe pain or tenderness.  You faint, have chills, severe weakness or dehydration.  You develop heavy vaginal bleeding or passing solid tissue.  You develop a temperature of 102 F (38.9 C) with the pain.  You have blood in the urine.  You are being physically or sexually abused.  You have uncontrolled vomiting and diarrhea.  You are depressed and afraid of harming yourself or someone else. Document Released: 12/31/2004 Document Revised: 02/15/2012 Document Reviewed: 09/27/2008 [facility] Patient Information 2013 [place], [place].

## 2013-01-24 NOTE — Assessment & Plan Note (Signed)
Presumptive tx and check culture.

## 2013-01-24 NOTE — Assessment & Plan Note (Signed)
Cauterized

## 2013-01-24 NOTE — Progress Notes (Signed)
  Subjective:    Patient ID: Colleen Haley, female    DOB: 08-04-1966, 47 y.o.   MRN: [id number]  HPI  S/P RAH with bilateral salpingectomy.  New onset pain and new dx of ovarian cyst.  Has known granulation tissue at vaginal cuff that needs treatment.  Remains constipated.  Review of Systems  Constitutional: Negative for fever and chills.  Respiratory: Negative for cough.   Gastrointestinal: Negative for nausea, vomiting and abdominal pain.  Genitourinary: Positive for dysuria and vaginal bleeding.       Objective:   Physical Exam  Vitals reviewed. Constitutional: She appears well-developed and well-nourished.  HENT:  Head: Normocephalic and atraumatic.  Eyes: No scleral icterus.  Neck: Neck supple.  Cardiovascular: Normal rate.   Pulmonary/Chest: Effort normal.  Abdominal: Soft. There is no tenderness.  Genitourinary: Vagina normal.  Uterus surgically absent.  Granulation tissue noted at top of cuff.   Procedure: Granulation tissue cauterized with AgNO3.  Pt. Tolerated well.  Labs: Urinalysis + leuks and mod. hgb          Assessment & Plan:

## 2013-01-26 LAB — CULTURE, URINE COMPREHENSIVE

## 2013-02-16 ENCOUNTER — Encounter: Payer: Managed Care, Other (non HMO) | Admitting: Family Medicine

## 2013-03-01 ENCOUNTER — Ambulatory Visit (INDEPENDENT_AMBULATORY_CARE_PROVIDER_SITE_OTHER): Payer: Managed Care, Other (non HMO) | Admitting: Family Medicine

## 2013-03-01 ENCOUNTER — Encounter: Payer: Self-pay | Admitting: Family Medicine

## 2013-03-01 VITALS — BP 110/80 | HR 80 | Temp 98.4°F | Ht 63.5 in | Wt 180.0 lb

## 2013-03-01 DIAGNOSIS — Z0000 Encounter for general adult medical examination without abnormal findings: Secondary | ICD-10-CM | POA: Insufficient documentation

## 2013-03-01 DIAGNOSIS — E559 Vitamin D deficiency, unspecified: Secondary | ICD-10-CM

## 2013-03-01 DIAGNOSIS — N393 Stress incontinence (female) (male): Secondary | ICD-10-CM

## 2013-03-01 LAB — POCT URINALYSIS DIPSTICK
Bilirubin, UA: NEGATIVE
Blood, UA: NEGATIVE
Glucose, UA: NEGATIVE
Ketones, UA: NEGATIVE
Spec Grav, UA: 1.015

## 2013-03-01 NOTE — Patient Instructions (Addendum)
Great to see you. We will call you with your CBC results and when your form is ready. 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

## 2013-03-01 NOTE — Progress Notes (Signed)
Subjective:    Patient ID: Colleen Haley, female    DOB: 08/21/1966, 47 y.o.   MRN: [id number]  HPI  47 yo female here for CPX and to fill out forms for nursing school.  G2P2.   No h/o abnormal pap smears. S/p hysterectomy with bladder sling in 10/2012.  Feels much better.  Urinary incontinence has improved as well.    Vitamin D deficiency, Vit D was 13 in April but was normal two months ago.   Taking Vit D 1000 IU daily.   Patient Active Problem List  Diagnosis  . Stress incontinence, female  . Positive TB test  . Vitamin D deficiency  . Dysuria  . Other and unspecified ovarian cyst  . Pelvic pain in female  . 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 Routine general medical examination at a health care facility   Past Medical History  Diagnosis Date  . Positive TB test   . Stress incontinence, female     Followed by Urology  . Constipation    Past Surgical History  Procedure Laterality Date  . Tubal ligation  1999    general anesthesia  . Vaginal delivery  [phone]  . Robotic assisted total hysterectomy  10/19/2012    Procedure: ROBOTIC ASSISTED TOTAL HYSTERECTOMY;  Surgeon: Allie Bossier, MD;  Location: [facility];  Service: Gynecology;  Laterality: N/A;  . Bilateral salpingectomy  10/19/2012    Procedure: BILATERAL SALPINGECTOMY;  Surgeon: Allie Bossier, MD;  Location: [facility];  Service: Gynecology;  Laterality: N/A;  . Bladder suspension  10/19/2012    Procedure: TRANSVAGINAL TAPE (TVT) PROCEDURE;  Surgeon: Allie Bossier, MD;  Location: [facility];  Service: Gynecology;  Laterality: N/A;  . Cystoscopy  10/19/2012    Procedure: CYSTOSCOPY;  Surgeon: Allie Bossier, MD;  Location: [facility];  Service: Gynecology;  Laterality: N/A;   History  Substance Use Topics  . Smoking status: Never Smoker   . Smokeless tobacco: Never Used  . Alcohol Use: 0.5 oz/week    1 drink(s) per week     Comment: occasional   Family History  Problem Relation Age of Onset  . Hyperlipidemia Mother   .  Hypertension Mother   . Hyperlipidemia Father   . Hypertension Father   . Glaucoma Father   . Diabetes Father   . Hyperlipidemia Sister    No Known Allergies Current Outpatient Prescriptions on File Prior to Visit  Medication Sig Dispense Refill  . ciprofloxacin (CIPRO) 500 MG tablet Take 1 tablet (500 mg total) by mouth 2 (two) times daily.  6 tablet  0  . docusate sodium (COLACE) 100 MG capsule Take 100 mg by mouth 2 (two) times daily.      Marland Kitchen ibuprofen (ADVIL,MOTRIN) 800 MG tablet Take 1 tablet (800 mg total) by mouth daily as needed. For pain or headache  90 tablet  2  . Vitamin D, Ergocalciferol, (DRISDOL) 50000 UNITS CAPS Take 50,000 Units by mouth every 7 (seven) days. Takes on Thursdays       No current facility-administered medications on file prior to visit.   The PMH, PSH, Social History, Family History, Medications, and allergies have been reviewed in [facility], and have been updated if relevant.   Review of Systems See HPI Patient reports no  vision/ hearing changes,anorexia, weight change, fever ,adenopathy, persistant / recurrent hoarseness, swallowing issues, chest pain, edema,persistant / recurrent cough, hemoptysis, dyspnea(rest, exertional, paroxysmal nocturnal), gastrointestinal  bleeding (melena, rectal bleeding), abdominal pain, excessive heart burn, GU  symptoms(dysuria, hematuria, pyuria, voiding/incontinence  Issues) syncope, focal weakness, severe memory loss, concerning skin lesions, depression, anxiety, abnormal bruising/bleeding, major joint swelling, breast masses or abnormal vaginal bleeding.       Objective:   Physical Exam BP 110/80  Pulse 80  Temp(Src) 98.4 F (36.9 C)  Ht 5' 3.5" (1.613 m)  Wt 180 lb (81.647 kg)  BMI 31.38 kg/m2  LMP 09/28/2012  General:  Well-developed,well-nourished,in no acute distress; alert,appropriate and cooperative throughout examination Head:  normocephalic and atraumatic.   Eyes:  vision grossly intact, pupils equal, pupils  round, and pupils reactive to light.   Ears:  R ear normal and L ear normal.   Nose:  no external deformity.   Mouth:  good dentition.   Neck:  No deformities, masses, or tenderness noted. Lungs:  Normal respiratory effort, chest expands symmetrically. Lungs are clear to auscultation, no crackles or wheezes. Heart:  Normal rate and regular rhythm. S1 and S2 normal without gallop, murmur, click, rub or other extra sounds. Abdomen:  Bowel sounds positive,abdomen soft and non-tender without masses, organomegaly or hernias noted. Msk:  No deformity or scoliosis noted of thoracic or lumbar spine.   Extremities:  No clubbing, cyanosis, edema, or deformity noted with normal full range of motion of all joints.   Neurologic:  alert & oriented X3 and gait normal.   Skin:  Intact without suspicious lesions or rashes Cervical Nodes:  No lymphadenopathy noted Axillary Nodes:  No palpable lymphadenopathy Psych:  Cognition and judgment appear intact. Alert and cooperative with normal attention span and concentration. No apparent delusions, illusions, hallucinations     Assessment & Plan:   1. Routine general medical examination at a health care facility Reviewed preventive care protocols, scheduled due services, and updated immunizations Discussed nutrition, exercise, diet, and healthy lifestyle.  Form filled out, will return to patient once lab work received.   - CBC with Differential

## 2013-03-02 LAB — CBC WITH DIFFERENTIAL
Basophils Absolute: 0 10*3/uL (ref 0.0–0.2)
Eos: 3 % (ref 0–5)
Eosinophils Absolute: 0.3 10*3/uL (ref 0.0–0.4)
Immature Grans (Abs): 0 10*3/uL (ref 0.0–0.1)
Immature Granulocytes: 0 % (ref 0–2)
MCH: 28.2 pg (ref 26.6–33.0)
MCHC: 32.5 g/dL (ref 31.5–35.7)
Monocytes Absolute: 0.4 10*3/uL (ref 0.1–0.9)
Neutrophils Relative %: 73 % (ref 40–74)
Platelets: 222 10*3/uL (ref 155–379)
RBC: 4.39 x10E6/uL (ref 3.77–5.28)
RDW: 14.7 % (ref 12.3–15.4)

## 2013-03-17 ENCOUNTER — Encounter: Payer: Self-pay | Admitting: Family Medicine

## 2013-04-24 ENCOUNTER — Telehealth: Payer: Self-pay | Admitting: *Deleted

## 2013-04-24 MED ORDER — LINACLOTIDE 290 MCG PO CAPS: 290.0000 ug | ORAL_CAPSULE | Freq: Every day | ORAL | Status: DC

## 2013-04-24 NOTE — Telephone Encounter (Signed)
Script sent in as instructed.

## 2013-04-24 NOTE — Telephone Encounter (Signed)
Pt states she has been getting samples of linzess 290 mcg's from her work and this is helping her.  She's asking if she can get a script for this sent to [facility].  Please advise.

## 2013-04-24 NOTE — Telephone Encounter (Signed)
Ok to refill as pt requested- #90, 3 refills.

## 2013-05-04 ENCOUNTER — Telehealth: Payer: Self-pay | Admitting: *Deleted

## 2013-05-04 NOTE — Telephone Encounter (Signed)
Prior Colleen Haley is needed for linzess, form is on your desk.

## 2013-05-05 NOTE — Telephone Encounter (Signed)
Completed and on my desk.

## 2013-05-05 NOTE — Telephone Encounter (Signed)
Form faxed

## 2013-05-09 NOTE — Telephone Encounter (Signed)
Prior auth given for linzess.  Approval letter placed on doctor's desk for signature and scanning.

## 2013-05-12 ENCOUNTER — Telehealth: Payer: Self-pay

## 2013-05-12 NOTE — Telephone Encounter (Signed)
PATIENT CALLED HAD A UTI, CALLED IN MACROBID 100 MG CAPS TO [facility]. NO REFILLS

## 2013-08-30 ENCOUNTER — Ambulatory Visit (INDEPENDENT_AMBULATORY_CARE_PROVIDER_SITE_OTHER): Payer: Managed Care, Other (non HMO) | Admitting: Family Medicine

## 2013-08-30 ENCOUNTER — Encounter: Payer: Self-pay | Admitting: Family Medicine

## 2013-08-30 VITALS — BP 128/76 | HR 79 | Temp 98.2°F | Wt 186.2 lb

## 2013-08-30 DIAGNOSIS — E559 Vitamin D deficiency, unspecified: Secondary | ICD-10-CM

## 2013-08-30 DIAGNOSIS — F411 Generalized anxiety disorder: Secondary | ICD-10-CM | POA: Insufficient documentation

## 2013-08-30 MED ORDER — CITALOPRAM HYDROBROMIDE 20 MG PO TABS: ORAL_TABLET | ORAL | Status: DC

## 2013-08-30 NOTE — Progress Notes (Signed)
Subjective:    Patient ID: Colleen Haley, female    DOB: [birth year], 47 y.o.   MRN: [id number]  HPI Very pleasant 47 yo female here to discuss anxiety.  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  Husband also not very supportive so then she is doing all the house work as well. Feels constantly anxious.  Having difficulty concentrating on her school work.  Not sleeping well.  Denies depression. No SI or HI.  Appetite good, feels she is gaining weight.  Wt Readings from Last 3 Encounters:  08/30/13 186 lb 4 oz (84.482 kg)  03/01/13 180 lb (81.647 kg)  12/02/12 179 lb 9.6 oz (81.466 kg)   Very fatigued- concerned about her Vit D.  Has a h/o Vit D deficiency.  Patient Active Problem List   Diagnosis Date Noted  . Anxiety state, unspecified 08/30/2013  . Routine general medical examination at a health care facility 03/01/2013  . Dysuria 01/24/2013  . Other and unspecified ovarian cyst 01/24/2013  . Pelvic pain in female 01/24/2013  . 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 Vitamin D deficiency 09/11/2011  . Stress incontinence, female   . Positive TB test    Past Medical History  Diagnosis Date  . Positive TB test   . Stress incontinence, female     Followed by Urology  . Constipation    Past Surgical History  Procedure Laterality Date  . Tubal ligation  1999    general anesthesia  . Vaginal delivery  [phone]  . Robotic assisted total hysterectomy  10/19/2012    Procedure: ROBOTIC ASSISTED TOTAL HYSTERECTOMY;  Surgeon: Allie Bossier, MD;  Location: [facility];  Service: Gynecology;  Laterality: N/A;  . Bilateral salpingectomy  10/19/2012    Procedure: BILATERAL SALPINGECTOMY;  Surgeon: Allie Bossier, MD;  Location: [facility];  Service: Gynecology;  Laterality: N/A;  . Bladder suspension  10/19/2012    Procedure: TRANSVAGINAL TAPE (TVT) PROCEDURE;  Surgeon: Allie Bossier, MD;  Location: [facility];  Service: Gynecology;  Laterality: N/A;  .  Cystoscopy  10/19/2012    Procedure: CYSTOSCOPY;  Surgeon: Allie Bossier, MD;  Location: [facility];  Service: Gynecology;  Laterality: N/A;   History  Substance Use Topics  . Smoking status: Never Smoker   . Smokeless tobacco: Never Used  . Alcohol Use: 0.5 oz/week    1 drink(s) per week     Comment: occasional   Family History  Problem Relation Age of Onset  . Hyperlipidemia Mother   . Hypertension Mother   . Hyperlipidemia Father   . Hypertension Father   . Glaucoma Father   . Diabetes Father   . Hyperlipidemia Sister    No Known Allergies Current Outpatient Prescriptions on File Prior to Visit  Medication Sig Dispense Refill  . ibuprofen (ADVIL,MOTRIN) 800 MG tablet Take 1 tablet (800 mg total) by mouth daily as needed. For pain or headache  90 tablet  2  . Linaclotide (LINZESS) 290 MCG CAPS Take 290 mcg by mouth daily. Take one by mouth daily  90 capsule  3  . Vitamin D, Ergocalciferol, (DRISDOL) 50000 UNITS CAPS Take 50,000 Units by mouth every 7 (seven) days. Takes on Thursdays       No current facility-administered medications on file prior to visit.   The PMH, PSH, Social History, Family History, Medications, and allergies have been reviewed in [facility], and have been updated if relevant.     Review  of Systems    See HPI Objective:   Physical Exam BP 128/76  Pulse 79  Temp(Src) 98.2 F (36.8 C) (Oral)  Wt 186 lb 4 oz (84.482 kg)  BMI 32.47 kg/m2  SpO2 99%  LMP 09/28/2012 Gen:  Alert, pleasant, NAD Psych:  Good eye contact, not anxious or depressed appearing    Assessment & Plan:  1. Vitamin D deficiency Recheck Vit D. - Vitamin D, 25-hydroxy  2. Anxiety state, unspecified >25 min spent with face to face with patient, >50% counseling and/or coordinating care I advised her to talk to her employer about cutting back on hours. She is interested in starting SSRI- will start Celexa 20 mg daily- start out with 10 mg daily and increase to 20 mg after one week. She  will call me in a few weeks with an update.

## 2013-08-30 NOTE — Addendum Note (Signed)
Addended by: Dianne Dun on: 08/30/2013 08:59 AM   Modules accepted: Orders

## 2013-08-30 NOTE — Patient Instructions (Addendum)
Good to see you. We will call you with your vitamin D results.  Hang in there.  We are staring celexa 20 mg daily (ok to take 10 mg daily for the first week).  Please call me in a few weeks with an update.

## 2013-08-31 ENCOUNTER — Telehealth: Payer: Self-pay

## 2013-08-31 NOTE — Telephone Encounter (Signed)
Pt requesting status of lab order for Vitamin D that was to be sent to [facility]. Shapele said Dr Dayton Martes sent to [facility]. Pt notified.

## 2013-09-08 ENCOUNTER — Encounter: Payer: Self-pay | Admitting: Family Medicine

## 2013-09-13 ENCOUNTER — Other Ambulatory Visit: Payer: Self-pay | Admitting: Family Medicine

## 2013-09-13 MED ORDER — BUPROPION HCL ER (XL) 150 MG PO TB24: 150.0000 mg | ORAL_TABLET | Freq: Every day | ORAL | Status: DC

## 2013-10-04 ENCOUNTER — Encounter: Payer: Self-pay | Admitting: Family Medicine

## 2013-10-04 ENCOUNTER — Ambulatory Visit: Payer: Self-pay | Admitting: Family Medicine

## 2013-10-12 ENCOUNTER — Other Ambulatory Visit: Payer: Self-pay

## 2013-10-12 ENCOUNTER — Other Ambulatory Visit: Payer: Self-pay | Admitting: *Deleted

## 2013-10-12 MED ORDER — IBUPROFEN 800 MG PO TABS: 800.0000 mg | ORAL_TABLET | Freq: Every day | ORAL | Status: DC | PRN

## 2013-10-12 MED ORDER — VITAMIN D (ERGOCALCIFEROL) 1.25 MG (50000 UNIT) PO CAPS: 50000.0000 [IU] | ORAL_CAPSULE | ORAL | Status: DC

## 2013-10-12 NOTE — Telephone Encounter (Signed)
Pt request refill ibuprofen and vit D 50,000 to [facility]; please see lab result note for Vit d. Pt does not need cb.

## 2013-10-12 NOTE — Telephone Encounter (Signed)
Ok to refill in Dr. Aron's absence? 

## 2013-11-14 ENCOUNTER — Telehealth: Payer: Self-pay

## 2013-11-14 MED ORDER — BUPROPION HCL ER (XL) 150 MG PO TB24: 150.0000 mg | ORAL_TABLET | Freq: Every day | ORAL | Status: DC

## 2013-11-14 NOTE — Telephone Encounter (Signed)
Pt left v/m [facility] [address] [place].refill done.

## 2013-11-14 NOTE — Telephone Encounter (Signed)
Pt left v/m requesting refill wellbutrin and did not leave pharmacy info. Left v/m for pt to cb.

## 2013-11-16 ENCOUNTER — Telehealth: Payer: Self-pay

## 2013-11-16 MED ORDER — BUPROPION HCL ER (XL) 150 MG PO TB24: 150.0000 mg | ORAL_TABLET | Freq: Every day | ORAL | Status: DC

## 2013-11-16 NOTE — Addendum Note (Signed)
Addended by: Roena Malady on: 11/16/2013 04:41 PM   Modules accepted: Orders

## 2013-11-16 NOTE — Telephone Encounter (Addendum)
Pt left voicemail with triage. She went to local pharmacy to pick up Wellbutrin and pharmacy advise her that her insurance will not pay for it unless she goes through mail order pharmacy. Pt request Rx sent to [facility], please advise

## 2013-11-16 NOTE — Telephone Encounter (Signed)
Rx sent to [facility]-will call pt to notify

## 2013-11-16 NOTE — Telephone Encounter (Signed)
Ok to send to [facility]

## 2013-11-16 NOTE — Telephone Encounter (Signed)
Left detailed message on voicemail letting pt know the Rx was resent to mail order

## 2014-02-21 ENCOUNTER — Other Ambulatory Visit: Payer: Self-pay

## 2014-02-21 MED ORDER — IBUPROFEN 800 MG PO TABS: 800.0000 mg | ORAL_TABLET | Freq: Every day | ORAL | Status: DC | PRN

## 2014-02-21 NOTE — Telephone Encounter (Signed)
Pt left v/m requesting refill ibuprofen to [facility].Please advise.

## 2014-04-26 ENCOUNTER — Ambulatory Visit (INDEPENDENT_AMBULATORY_CARE_PROVIDER_SITE_OTHER): Payer: Managed Care, Other (non HMO) | Admitting: Family Medicine

## 2014-04-26 ENCOUNTER — Encounter: Payer: Self-pay | Admitting: Family Medicine

## 2014-04-26 VITALS — BP 120/88 | HR 68 | Temp 97.7°F | Ht 63.25 in | Wt 154.5 lb

## 2014-04-26 DIAGNOSIS — Z0289 Encounter for other administrative examinations: Secondary | ICD-10-CM

## 2014-04-26 DIAGNOSIS — Z136 Encounter for screening for cardiovascular disorders: Secondary | ICD-10-CM

## 2014-04-26 DIAGNOSIS — E559 Vitamin D deficiency, unspecified: Secondary | ICD-10-CM | POA: Insufficient documentation

## 2014-04-26 DIAGNOSIS — Z020 Encounter for examination for admission to educational institution: Secondary | ICD-10-CM

## 2014-04-26 DIAGNOSIS — Z0000 Encounter for general adult medical examination without abnormal findings: Secondary | ICD-10-CM

## 2014-04-26 MED ORDER — BUPROPION HCL ER (XL) 150 MG PO TB24: 150.0000 mg | ORAL_TABLET | Freq: Every day | ORAL | Status: DC

## 2014-04-26 MED ORDER — VITAMIN D (ERGOCALCIFEROL) 1.25 MG (50000 UNIT) PO CAPS: 50000.0000 [IU] | ORAL_CAPSULE | ORAL | Status: DC

## 2014-04-26 MED ORDER — LUBIPROSTONE 24 MCG PO CAPS: 24.0000 ug | ORAL_CAPSULE | Freq: Two times a day (BID) | ORAL | Status: DC

## 2014-04-26 NOTE — Progress Notes (Signed)
Subjective:    Patient ID: Colleen Haley, female    DOB: 09-22-1966, 48 y.o.   MRN: [id number]  HPI  48 yo female here for CPX and to fill out forms for nursing school.  G2P2.   No h/o abnormal pap smears. S/p hysterectomy with bladder sling in 10/2012.  Feels much better.  Urinary incontinence has improved as well.    Mammogram UTD- 09/2013.  She is due for labs.  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  Wants blood work to make sure it is nothing to be concerned about.  Lab Results  Component Value Date   CHOL 219* 08/09/2011   HDL 47 08/29/2012   LDLCALC 134* 08/29/2012   TRIG 147 08/29/2012   CHOLHDL 4.5* 08/29/2012   Lab Results  Component Value Date   WBC 10.2 03/01/2013   HGB 12.4 03/01/2013   HCT 38.2 03/01/2013   MCV 87 03/01/2013   PLT 222 03/01/2013   Lab Results  Component Value Date   CREATININE 0.74 08/29/2012     Patient Active Problem List   Diagnosis Date Noted  . Anxiety state, unspecified 08/30/2013  . Routine general medical examination at a health care facility 03/01/2013  . Dysuria 01/24/2013  . Other and unspecified ovarian cyst 01/24/2013  . Pelvic pain in female 01/24/2013  . 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 Vitamin D deficiency 09/11/2011  . Stress incontinence, female   . Positive TB test    Past Medical History  Diagnosis Date  . Positive TB test   . Stress incontinence, female     Followed by Urology  . Constipation    Past Surgical History  Procedure Laterality Date  . Tubal ligation  1999    general anesthesia  . Vaginal delivery  [phone]  . Robotic assisted total hysterectomy  10/19/2012    Procedure: ROBOTIC ASSISTED TOTAL HYSTERECTOMY;  Surgeon: Emily Filbert, MD;  Location: [place] [facility];  Service: Gynecology;  Laterality: N/A;  . Bilateral salpingectomy  10/19/2012    Procedure: BILATERAL SALPINGECTOMY;  Surgeon: Emily Filbert, MD;  Location: [place] [facility];  Service: Gynecology;  Laterality: N/A;   . Bladder suspension  10/19/2012    Procedure: TRANSVAGINAL TAPE (TVT) PROCEDURE;  Surgeon: Emily Filbert, MD;  Location: [place] [facility];  Service: Gynecology;  Laterality: N/A;  . Cystoscopy  10/19/2012    Procedure: CYSTOSCOPY;  Surgeon: Emily Filbert, MD;  Location: [facility];  Service: Gynecology;  Laterality: N/A;   History  Substance Use Topics  . Smoking status: Never Smoker   . Smokeless tobacco: Never Used  . Alcohol Use: 0.5 oz/week    1 drink(s) per week     Comment: occasional   Family History  Problem Relation Age of Onset  . Hyperlipidemia Mother   . Hypertension Mother   . Hyperlipidemia Father   . Hypertension Father   . Glaucoma Father   . Diabetes Father   . Hyperlipidemia Sister    No Known Allergies Current Outpatient Prescriptions on File Prior to Visit  Medication Sig Dispense Refill  . ibuprofen (ADVIL,MOTRIN) 800 MG tablet Take 1 tablet (800 mg total) by mouth daily as needed. For pain or headache  90 tablet  2   No current facility-administered medications on file prior to visit.   The PMH, PSH, Social History, Family History, Medications, and allergies have been reviewed in [facility], and have been updated if relevant.   Review of Systems See HPI  Patient reports no  vision/ hearing changes,anorexia, weight change, fever ,adenopathy, persistant / recurrent hoarseness, swallowing issues, chest pain, edema,persistant / recurrent cough, hemoptysis, dyspnea(rest, exertional, paroxysmal nocturnal), gastrointestinal  bleeding (melena, rectal bleeding), abdominal pain, excessive heart burn, GU symptoms(dysuria, hematuria, pyuria, voiding/incontinence  Issues) syncope, focal weakness, severe memory loss, concerning skin lesions, depression, anxiety, abnormal bruising/bleeding, major joint swelling, breast masses or abnormal vaginal bleeding.       Objective:   Physical Exam BP 120/88  Pulse 68  Temp(Src) 97.7 F (36.5 C) (Oral)  Ht 5' 3.25" (1.607 m)  Wt 154 lb 8 oz (70.081  kg)  BMI 27.14 kg/m2  SpO2 99%  LMP 09/28/2012  General:  Well-developed,well-nourished,in no acute distress; alert,appropriate and cooperative throughout examination Head:  normocephalic and atraumatic.   Eyes:  vision grossly intact, pupils equal, pupils round, and pupils reactive to light.   Ears:  R ear normal and L ear normal.   Nose:  no external deformity.   Mouth:  good dentition.   Neck:  No deformities, masses, or tenderness noted. Lungs:  Normal respiratory effort, chest expands symmetrically. Lungs are clear to auscultation, no crackles or wheezes. Heart:  Normal rate and regular rhythm. S1 and S2 normal without gallop, murmur, click, rub or other extra sounds. Abdomen:  Bowel sounds positive,abdomen soft and non-tender without masses, organomegaly or hernias noted. Msk:  No deformity or scoliosis noted of thoracic or lumbar spine.   Extremities:  No clubbing, cyanosis, edema, or deformity noted with normal full range of motion of all joints.   Neurologic:  alert & oriented X3 and gait normal.   Skin:  Intact without suspicious lesions or rashes Cervical Nodes:  No lymphadenopathy noted Axillary Nodes:  No palpable lymphadenopathy Psych:  Cognition and judgment appear intact. Alert and cooperative with normal attention span and concentration. No apparent delusions, illusions, hallucinations     Assessment & Plan:

## 2014-04-26 NOTE — Assessment & Plan Note (Signed)
Reviewed preventive care protocols, scheduled due services, and updated immunizations Discussed nutrition, exercise, diet, and healthy lifestyle.  Orders Placed This Encounter  Procedures  . Lipid panel  . Vitamin D, 25-hydroxy  . Comprehensive metabolic panel  . TSH  . CBC with Differential  . Urinalysis, microscopic only

## 2014-04-26 NOTE — Addendum Note (Signed)
Addended by: Ellamae Sia on: 04/26/2014 03:27 PM   Modules accepted: Orders

## 2014-04-26 NOTE — Patient Instructions (Signed)
Great to see you. We will call you with your lab results. Or you can see it online.

## 2014-04-26 NOTE — Progress Notes (Signed)
Pre visit review using our clinic review tool, if applicable. No additional management support is needed unless otherwise documented below in the visit note. 

## 2014-04-26 NOTE — Addendum Note (Signed)
Addended by: Marchia Bond on: 04/26/2014 01:42 PM   Modules accepted: Orders

## 2014-04-27 ENCOUNTER — Telehealth: Payer: Self-pay | Admitting: *Deleted

## 2014-04-27 LAB — COMPREHENSIVE METABOLIC PANEL
ALBUMIN: 4.4 g/dL (ref 3.5–5.5)
ALT: 15 IU/L (ref 0–32)
AST: 10 IU/L (ref 0–40)
Albumin/Globulin Ratio: 1.8 (ref 1.1–2.5)
Alkaline Phosphatase: 63 IU/L (ref 39–117)
BUN/Creatinine Ratio: 9 (ref 9–23)
BUN: 8 mg/dL (ref 6–24)
CALCIUM: 9.9 mg/dL (ref 8.7–10.2)
CO2: 20 mmol/L (ref 18–29)
CREATININE: 0.86 mg/dL (ref 0.57–1.00)
Chloride: 103 mmol/L (ref 97–108)
GFR calc Af Amer: 94 mL/min/{1.73_m2} (ref >59)
GFR calc non Af Amer: 81 mL/min/{1.73_m2} (ref >59)
GLOBULIN, TOTAL: 2.4 g/dL (ref 1.5–4.5)
Glucose: 81 mg/dL (ref 65–99)
Potassium: 4.4 mmol/L (ref 3.5–5.2)
Sodium: 140 mmol/L (ref 134–144)
TOTAL PROTEIN: 6.8 g/dL (ref 6.0–8.5)
Total Bilirubin: 0.5 mg/dL (ref 0.0–1.2)

## 2014-04-27 LAB — URINALYSIS, COMPLETE
Bilirubin, UA: NEGATIVE
Glucose, UA: NEGATIVE
Ketones, UA: NEGATIVE
Leukocytes, UA: NEGATIVE
NITRITE UA: NEGATIVE
Protein, UA: NEGATIVE
RBC, UA: NEGATIVE
SPEC GRAV UA: 1.021 (ref 1.005–1.030)
UUROB: 0.2 mg/dL (ref 0.0–1.9)
pH, UA: 6.5 (ref 5.0–7.5)

## 2014-04-27 LAB — LIPID PANEL
Chol/HDL Ratio: 4.6 ratio units — ABNORMAL HIGH (ref 0.0–4.4)
Cholesterol, Total: 212 mg/dL — ABNORMAL HIGH (ref 100–199)
HDL: 46 mg/dL (ref >39)
LDL Calculated: 144 mg/dL — ABNORMAL HIGH (ref 0–99)
Triglycerides: 110 mg/dL (ref 0–149)
VLDL CHOLESTEROL CAL: 22 mg/dL (ref 5–40)

## 2014-04-27 LAB — CBC WITH DIFFERENTIAL/PLATELET
Basophils Absolute: 0 10*3/uL (ref 0.0–0.2)
Basos: 0 %
Eos: 1 %
Eosinophils Absolute: 0.1 10*3/uL (ref 0.0–0.4)
HEMATOCRIT: 39.8 % (ref 34.0–46.6)
Hemoglobin: 12.9 g/dL (ref 11.1–15.9)
IMMATURE GRANS (ABS): 0 10*3/uL (ref 0.0–0.1)
IMMATURE GRANULOCYTES: 0 %
LYMPHS ABS: 2.4 10*3/uL (ref 0.7–3.1)
Lymphs: 25 %
MCH: 30.2 pg (ref 26.6–33.0)
MCHC: 32.4 g/dL (ref 31.5–35.7)
MCV: 93 fL (ref 79–97)
MONOCYTES: 5 %
Monocytes Absolute: 0.5 10*3/uL (ref 0.1–0.9)
Neutrophils Absolute: 6.3 10*3/uL (ref 1.4–7.0)
Neutrophils Relative %: 69 %
RBC: 4.27 x10E6/uL (ref 3.77–5.28)
RDW: 13 % (ref 12.3–15.4)
WBC: 9.3 10*3/uL (ref 3.4–10.8)

## 2014-04-27 LAB — MICROSCOPIC EXAMINATION
Bacteria, UA: NONE SEEN
Epithelial Cells (non renal): >10 /hpf — AB (ref 0–10)

## 2014-04-27 LAB — TSH: TSH: 1.41 u[IU]/mL (ref 0.450–4.500)

## 2014-04-27 LAB — VITAMIN D 25 HYDROXY (VIT D DEFICIENCY, FRACTURES): Vit D, 25-Hydroxy: 19.4 ng/mL — ABNORMAL LOW (ref 30.0–100.0)

## 2014-04-27 NOTE — Telephone Encounter (Signed)
Spoke to pt and informed her PA has been approved for Amitiza. Lloyd will send to pt as requested.

## 2014-04-27 NOTE — Telephone Encounter (Signed)
Spoke to pt who states that she has tried all OTC meds for constipation with no relief. Informed pt that she does not meet the requirements as listed on the PA form, but that we would proceed with submitting it and await the approval/denial.

## 2014-04-27 NOTE — Telephone Encounter (Signed)
Lm on pts vm requesting a call back regarding Amitiza Rx not being covered, and requiring a PA. Additional information is needed from pt from to process

## 2014-04-29 LAB — QUANTIFERON IN TUBE
QFT TB AG MINUS NIL VALUE: 0 [IU]/mL
QUANTIFERON MITOGEN VALUE: 9.38 IU/mL
QUANTIFERON TB AG VALUE: 0.02 [IU]/mL
QUANTIFERON TB GOLD: NEGATIVE
Quantiferon Nil Value: 0.02 IU/mL

## 2014-04-29 LAB — QUANTIFERON TB GOLD ASSAY (BLOOD)

## 2014-05-02 ENCOUNTER — Telehealth: Payer: Self-pay | Admitting: *Deleted

## 2014-05-02 NOTE — Telephone Encounter (Signed)
Pt contacted office requesting to know if paperwork is available for pickup. Pt was informed that all of her labs are not back as of yet, and we will contact her when the paperwork is completed and ready for pickup

## 2014-05-09 ENCOUNTER — Other Ambulatory Visit: Payer: Self-pay | Admitting: *Deleted

## 2014-05-09 MED ORDER — LUBIPROSTONE 24 MCG PO CAPS: 24.0000 ug | ORAL_CAPSULE | Freq: Two times a day (BID) | ORAL | Status: DC

## 2014-05-09 NOTE — Telephone Encounter (Signed)
Pt came into office to pickup completed paperwork and requested medication refill to local pharmacy. She states mail order was to expensive

## 2014-06-15 NOTE — Telephone Encounter (Signed)
Pt called pt had amitiza rx transferred to [address] due to cost of med; pt said [facility] said pt does not have refill available. Spoke with Theadora Rama at [address] and pt has # 91 left of amitiza; Theadora Rama is not positive why there is # 64 instead of # 78. I asked Brandy to use the availble refill but to chg quantity to # 60 instead of # 54. Brand voiced understanding and rx will be ready in 1 hr for pick up. Pt notified and voiced understanding.

## 2014-06-28 ENCOUNTER — Other Ambulatory Visit: Payer: Self-pay | Admitting: Family Medicine

## 2014-10-08 ENCOUNTER — Encounter: Payer: Self-pay | Admitting: Family Medicine

## 2015-06-19 ENCOUNTER — Ambulatory Visit (INDEPENDENT_AMBULATORY_CARE_PROVIDER_SITE_OTHER): Payer: Managed Care, Other (non HMO) | Admitting: Family Medicine

## 2015-06-19 ENCOUNTER — Encounter: Payer: Self-pay | Admitting: Family Medicine

## 2015-06-19 VITALS — BP 122/82 | HR 84 | Temp 97.9°F | Ht 63.5 in | Wt 186.0 lb

## 2015-06-19 DIAGNOSIS — E559 Vitamin D deficiency, unspecified: Secondary | ICD-10-CM | POA: Diagnosis not present

## 2015-06-19 DIAGNOSIS — F411 Generalized anxiety disorder: Secondary | ICD-10-CM | POA: Diagnosis not present

## 2015-06-19 DIAGNOSIS — Z0000 Encounter for general adult medical examination without abnormal findings: Secondary | ICD-10-CM | POA: Diagnosis not present

## 2015-06-19 DIAGNOSIS — R7611 Nonspecific reaction to tuberculin skin test without active tuberculosis: Secondary | ICD-10-CM | POA: Diagnosis not present

## 2015-06-19 MED ORDER — BUPROPION HCL ER (XL) 150 MG PO TB24: 150.0000 mg | ORAL_TABLET | Freq: Every day | ORAL | Status: DC

## 2015-06-19 MED ORDER — VITAMIN D (ERGOCALCIFEROL) 1.25 MG (50000 UNIT) PO CAPS: 50000.0000 [IU] | ORAL_CAPSULE | ORAL | Status: DC

## 2015-06-19 NOTE — Assessment & Plan Note (Signed)
Reviewed preventive care protocols, scheduled due services, and updated immunizations Discussed nutrition, exercise, diet, and healthy lifestyle.  

## 2015-06-19 NOTE — Assessment & Plan Note (Signed)
Quant today. Orders Placed This Encounter  Procedures  . Quantiferon tb gold assay  . CBC with Differential/Platelet  . Comprehensive metabolic panel  . Lipid panel  . TSH  . Vitamin D, 25-hydroxy

## 2015-06-19 NOTE — Assessment & Plan Note (Signed)
Deteriorated. Wellbutrin refilled. Call or return to clinic prn if these symptoms worsen or fail to improve as anticipated. The patient indicates understanding of these issues and agrees with the plan.

## 2015-06-19 NOTE — Progress Notes (Signed)
Pre visit review using our clinic review tool, if applicable. No additional management support is needed unless otherwise documented below in the visit note. 

## 2015-06-19 NOTE — Assessment & Plan Note (Signed)
Likely deteriorated. Check vit D today. Vit D 50,000 IU weekly refilled

## 2015-06-19 NOTE — Progress Notes (Signed)
Subjective:    Patient ID: Colleen Haley, female    DOB: 06/16/1966, 49 y.o.   MRN: [id number]  HPI  49 yo female here for CPX, to fill out forms for nursing school, and follow up of chronic medical conditions.  G2P2.   No h/o abnormal pap smears. S/p hysterectomy with bladder sling in 10/2012.  Feels much better.  Urinary incontinence has improved as well.    Mammogram 09/2013.  Stopped taking Wellbutrin when she ran out.  Has noticed that she is more anxious at school and not concentrating as well.  Denies feeling depressed.  Sleeping well.  Appetite good.  Wt Readings from Last 3 Encounters:  06/19/15 186 lb (84.369 kg)  04/26/14 154 lb 8 oz (70.081 kg)  08/30/13 186 lb 4 oz (84.482 kg)   H/o Vit D deficiency- has been more tired lately.  Lab Results  Component Value Date   CHOL 212* 04/26/2014   HDL 46 04/26/2014   LDLCALC 144* 04/26/2014   TRIG 110 04/26/2014   CHOLHDL 4.6* 04/26/2014   Lab Results  Component Value Date   WBC 9.3 04/26/2014   HGB 12.9 04/26/2014   HCT 39.8 04/26/2014   MCV 93 04/26/2014   PLT 222 03/01/2013   Lab Results  Component Value Date   CREATININE 0.86 04/26/2014     Patient Active Problem List   Diagnosis Date Noted  . Unspecified vitamin D deficiency 04/26/2014  . Anxiety state, unspecified 08/30/2013  . Routine general medical examination at a health care facility 03/01/2013  . Dysuria 01/24/2013  . Other and unspecified ovarian cyst 01/24/2013  . Pelvic pain in female 01/24/2013  . 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 Vitamin D deficiency 09/11/2011  . Stress incontinence, female   . Positive TB test    Past Medical History  Diagnosis Date  . Positive TB test   . Stress incontinence, female     Followed by Urology  . Constipation    Past Surgical History  Procedure Laterality Date  . Tubal ligation  1999    general anesthesia  . Vaginal delivery  [phone]  . Robotic assisted total hysterectomy   10/19/2012    Procedure: ROBOTIC ASSISTED TOTAL HYSTERECTOMY;  Surgeon: Emily Filbert, MD;  Location: [facility];  Service: Gynecology;  Laterality: N/A;  . Bilateral salpingectomy  10/19/2012    Procedure: BILATERAL SALPINGECTOMY;  Surgeon: Emily Filbert, MD;  Location: [facility] [facility];  Service: Gynecology;  Laterality: N/A;  . Bladder suspension  10/19/2012    Procedure: TRANSVAGINAL TAPE (TVT) PROCEDURE;  Surgeon: Emily Filbert, MD;  Location: [facility];  Service: Gynecology;  Laterality: N/A;  . Cystoscopy  10/19/2012    Procedure: CYSTOSCOPY;  Surgeon: Emily Filbert, MD;  Location: [facility];  Service: Gynecology;  Laterality: N/A;   History  Substance Use Topics  . Smoking status: Never Smoker   . Smokeless tobacco: Never Used  . Alcohol Use: 0.5 oz/week    1 drink(s) per week     Comment: occasional   Family History  Problem Relation Age of Onset  . Hyperlipidemia Mother   . Hypertension Mother   . Hyperlipidemia Father   . Hypertension Father   . Glaucoma Father   . Diabetes Father   . Hyperlipidemia Sister    No Known Allergies Current Outpatient Prescriptions on File Prior to Visit  Medication Sig Dispense Refill  . buPROPion (WELLBUTRIN XL) 150 MG 24 hr tablet Take 1 tablet (150 mg total)  by mouth daily. 90 tablet 0  . ibuprofen (ADVIL,MOTRIN) 800 MG tablet Take 1 tablet (800 mg total) by mouth daily as needed. For pain or headache 90 tablet 2  . lubiprostone (AMITIZA) 24 MCG capsule Take 1 capsule (24 mcg total) by mouth 2 (two) times daily with a meal. 60 capsule 1  . Vitamin D, Ergocalciferol, (DRISDOL) 50000 UNITS CAPS capsule Take 1 capsule (50,000 Units total) by mouth every 7 (seven) days. Takes on Thursdays 15 capsule 0   No current facility-administered medications on file prior to visit.   The PMH, PSH, Social History, Family History, Medications, and allergies have been reviewed in [facility], and have been updated if relevant.   Review of Systems Review of Systems  Constitutional:  Positive for fatigue. Negative for unexpected weight change.  HENT: Negative.   Respiratory: Negative.   Cardiovascular: Negative.   Gastrointestinal: Negative.   Endocrine: Negative.   Genitourinary: Negative.   Musculoskeletal: Negative.   Skin: Negative.   Neurological: Negative.   Psychiatric/Behavioral: Positive for decreased concentration. Negative for suicidal ideas, hallucinations, sleep disturbance, self-injury and dysphoric mood. The patient is nervous/anxious. The patient is not hyperactive.   All other systems reviewed and are negative.      Objective:   Physical Exam LMP 09/28/2012  General:  Well-developed,well-nourished,in no acute distress; alert,appropriate and cooperative throughout examination Head:  normocephalic and atraumatic.   Eyes:  vision grossly intact, pupils equal, pupils round, and pupils reactive to light.   Ears:  R ear normal and L ear normal.   Nose:  no external deformity.   Mouth:  good dentition.   Neck:  No deformities, masses, or tenderness noted. Lungs:  Normal respiratory effort, chest expands symmetrically. Lungs are clear to auscultation, no crackles or wheezes. Heart:  Normal rate and regular rhythm. S1 and S2 normal without gallop, murmur, click, rub or other extra sounds. Abdomen:  Bowel sounds positive,abdomen soft and non-tender without masses, organomegaly or hernias noted. Msk:  No deformity or scoliosis noted of thoracic or lumbar spine.   Extremities:  No clubbing, cyanosis, edema, or deformity noted with normal full range of motion of all joints.   Neurologic:  alert & oriented X3 and gait normal.   Skin:  Intact without suspicious lesions or rashes Cervical Nodes:  No lymphadenopathy noted Axillary Nodes:  No palpable lymphadenopathy Psych:  Cognition and judgment appear intact. Alert and cooperative with normal attention span and concentration. No apparent delusions, illusions, hallucinations     Assessment & Plan:

## 2015-06-19 NOTE — Patient Instructions (Signed)
Good to see you grandma! We will call you with your lab results.

## 2015-06-19 NOTE — Addendum Note (Signed)
Addended by: Daralene Milch C on: 06/19/2015 02:00 PM   Modules accepted: Orders, SmartSet

## 2015-06-20 LAB — VITAMIN D 25 HYDROXY (VIT D DEFICIENCY, FRACTURES): VIT D 25 HYDROXY: 18.6 ng/mL — AB (ref 30.0–100.0)

## 2015-06-20 LAB — COMPREHENSIVE METABOLIC PANEL
ALBUMIN: 4.6 g/dL (ref 3.5–5.5)
ALK PHOS: 69 IU/L (ref 39–117)
ALT: 17 IU/L (ref 0–32)
AST: 10 IU/L (ref 0–40)
Albumin/Globulin Ratio: 1.8 (ref 1.1–2.5)
BUN/Creatinine Ratio: 15 (ref 9–23)
BUN: 10 mg/dL (ref 6–24)
Bilirubin Total: 0.3 mg/dL (ref 0.0–1.2)
CO2: 18 mmol/L (ref 18–29)
Calcium: 10.4 mg/dL — ABNORMAL HIGH (ref 8.7–10.2)
Chloride: 105 mmol/L (ref 97–108)
Creatinine, Ser: 0.65 mg/dL (ref 0.57–1.00)
GFR calc non Af Amer: 106 mL/min/{1.73_m2} (ref >59)
GFR, EST AFRICAN AMERICAN: 122 mL/min/{1.73_m2} (ref >59)
GLOBULIN, TOTAL: 2.6 g/dL (ref 1.5–4.5)
GLUCOSE: 96 mg/dL (ref 65–99)
Potassium: 4.2 mmol/L (ref 3.5–5.2)
Sodium: 143 mmol/L (ref 134–144)
Total Protein: 7.2 g/dL (ref 6.0–8.5)

## 2015-06-20 LAB — CBC WITH DIFFERENTIAL/PLATELET
BASOS ABS: 0 10*3/uL (ref 0.0–0.2)
BASOS: 0 %
EOS (ABSOLUTE): 0.1 10*3/uL (ref 0.0–0.4)
Eos: 1 %
HEMATOCRIT: 39.9 % (ref 34.0–46.6)
Hemoglobin: 13 g/dL (ref 11.1–15.9)
Immature Grans (Abs): 0 10*3/uL (ref 0.0–0.1)
Immature Granulocytes: 0 %
LYMPHS ABS: 2.3 10*3/uL (ref 0.7–3.1)
Lymphs: 23 %
MCH: 29.3 pg (ref 26.6–33.0)
MCHC: 32.6 g/dL (ref 31.5–35.7)
MCV: 90 fL (ref 79–97)
MONOCYTES: 4 %
Monocytes Absolute: 0.4 10*3/uL (ref 0.1–0.9)
NEUTROS ABS: 7.3 10*3/uL — AB (ref 1.4–7.0)
NEUTROS PCT: 72 %
Platelets: 189 10*3/uL (ref 150–379)
RBC: 4.44 x10E6/uL (ref 3.77–5.28)
RDW: 13 % (ref 12.3–15.4)
WBC: 10.2 10*3/uL (ref 3.4–10.8)

## 2015-06-20 LAB — LIPID PANEL
Chol/HDL Ratio: 5.6 ratio units — ABNORMAL HIGH (ref 0.0–4.4)
Cholesterol, Total: 228 mg/dL — ABNORMAL HIGH (ref 100–199)
HDL: 41 mg/dL (ref >39)
LDL CALC: 153 mg/dL — AB (ref 0–99)
TRIGLYCERIDES: 172 mg/dL — AB (ref 0–149)
VLDL Cholesterol Cal: 34 mg/dL (ref 5–40)

## 2015-06-20 LAB — TSH: TSH: 0.946 u[IU]/mL (ref 0.450–4.500)

## 2015-06-21 LAB — QUANTIFERON TB GOLD ASSAY (BLOOD)

## 2015-06-21 LAB — QUANTIFERON IN TUBE
QUANTIFERON MITOGEN VALUE: 9.91 IU/mL
QUANTIFERON NIL VALUE: 0.04 [IU]/mL
QUANTIFERON TB AG VALUE: 0.03 IU/mL
QUANTIFERON TB GOLD: NEGATIVE

## 2015-06-25 ENCOUNTER — Telehealth: Payer: Self-pay

## 2015-06-25 ENCOUNTER — Ambulatory Visit: Payer: Managed Care, Other (non HMO)

## 2015-06-25 NOTE — Telephone Encounter (Signed)
Pt came to office for tetanus immunization; pt had tdap 05/12/2011; pt said [employer] was requiring plain tetanus; advised pt our office has Td and Tdap. Pt said she would get plain tetanus at [facility] [employer] where she works. Nurse visit cancelled.

## 2015-07-01 ENCOUNTER — Other Ambulatory Visit: Payer: Self-pay | Admitting: Family Medicine

## 2015-07-01 DIAGNOSIS — Z1231 Encounter for screening mammogram for malignant neoplasm of breast: Secondary | ICD-10-CM

## 2015-07-02 ENCOUNTER — Ambulatory Visit
Admission: RE | Admit: 2015-07-02 | Discharge: 2015-07-02 | Disposition: A | Discharge status: Home / Self Care | Payer: Managed Care, Other (non HMO) | Source: Ambulatory Visit | Attending: Family Medicine | Admitting: Family Medicine

## 2015-07-02 DIAGNOSIS — Z1231 Encounter for screening mammogram for malignant neoplasm of breast: Secondary | ICD-10-CM | POA: Insufficient documentation

## 2015-09-25 ENCOUNTER — Ambulatory Visit (INDEPENDENT_AMBULATORY_CARE_PROVIDER_SITE_OTHER): Payer: Managed Care, Other (non HMO)

## 2015-09-25 DIAGNOSIS — Z23 Encounter for immunization: Secondary | ICD-10-CM

## 2015-11-23 ENCOUNTER — Ambulatory Visit
Admission: EM | Admit: 2015-11-23 | Discharge: 2015-11-23 | Disposition: A | Discharge status: Home / Self Care | Payer: Managed Care, Other (non HMO) | Attending: Internal Medicine | Admitting: Internal Medicine

## 2015-11-23 DIAGNOSIS — J019 Acute sinusitis, unspecified: Secondary | ICD-10-CM

## 2015-11-23 DIAGNOSIS — J209 Acute bronchitis, unspecified: Secondary | ICD-10-CM | POA: Diagnosis not present

## 2015-11-23 HISTORY — DX: Migraine, unspecified, not intractable, without status migrainosus: G43.909

## 2015-11-23 MED ORDER — AMOXICILLIN-POT CLAVULANATE 875-125 MG PO TABS: 1.0000 | ORAL_TABLET | Freq: Two times a day (BID) | ORAL | Status: AC

## 2015-11-23 MED ORDER — PREDNISONE 50 MG PO TABS: 50.0000 mg | ORAL_TABLET | Freq: Every day | ORAL | Status: DC

## 2015-11-23 NOTE — Discharge Instructions (Signed)
Prescriptions for prednisone and augmentin were sent to the [facility] on [address]. Nasacort otc may help with congestion.  Afrin before a flight, if you are still congested, helps a lot. Recheck or followup with PCP/Dr Deborra Medina for new fever >100.5, increasing phlegm production, or if not starting to improve in a few days. Anticipate gradual improvement in cough/congestion over the next 2-3 weeks.

## 2015-11-23 NOTE — ED Provider Notes (Signed)
CSN: [id number]     Arrival date & time 11/23/15  1430 History   First MD Initiated Contact with Patient 11/23/15 1542     Chief Complaint  Patient presents with  . Cough    Cough x 5 weeks. Productive of yellow phlegm. Has tried Mucinex but no improvement. Denies SOB or sore throat. Denies fever.   HPI  Patient is a 49 year old lady who presents today with about 5 weeks of sinus congestion, sinus drainage, and a lot of coughing. Coughing has gotten worse in the last several days, with spells of coughing. No fever. Just not resolving  Past Medical History  Diagnosis Date  . Positive TB test   . Stress incontinence, female     Followed by Urology  . Constipation   . Migraines    Past Surgical History  Procedure Laterality Date  . Tubal ligation  1999    general anesthesia  . Vaginal delivery  [phone]  . Robotic assisted total hysterectomy  10/19/2012    Procedure: ROBOTIC ASSISTED TOTAL HYSTERECTOMY;  Surgeon: Emily Filbert, MD;  Location: [facility];  Service: Gynecology;  Laterality: N/A;  . Bilateral salpingectomy  10/19/2012    Procedure: BILATERAL SALPINGECTOMY;  Surgeon: Emily Filbert, MD;  Location: [facility];  Service: Gynecology;  Laterality: N/A;  . Bladder suspension  10/19/2012    Procedure: TRANSVAGINAL TAPE (TVT) PROCEDURE;  Surgeon: Emily Filbert, MD;  Location: [facility];  Service: Gynecology;  Laterality: N/A;  . Cystoscopy  10/19/2012    Procedure: CYSTOSCOPY;  Surgeon: Emily Filbert, MD;  Location: [facility];  Service: Gynecology;  Laterality: N/A;   Family History  Problem Relation Age of Onset  . Hyperlipidemia Mother   . Hypertension Mother   . Hyperlipidemia Father   . Hypertension Father   . Glaucoma Father   . Diabetes Father   . Hyperlipidemia Sister    Social History  Substance Use Topics  . Smoking status: Never Smoker   . Smokeless tobacco: Never Used  . Alcohol Use: 0.5 oz/week    1 Standard drinks or equivalent per week     Comment: occasional   OB  History    Gravida Para Term Preterm AB TAB SAB Ectopic Multiple Living   2 2 2       2      Review of Systems  All other systems reviewed and are negative.   Allergies  Review of patient's allergies indicates no known allergies.  Home Medications   Prior to Admission medications   Medication Sig Start Date End Date Taking? Authorizing Provider         buPROPion (WELLBUTRIN XL) 150 MG 24 hr tablet Take 1 tablet (150 mg total) by mouth daily. 06/19/15   [name], MD  ibuprofen (ADVIL,MOTRIN) 800 MG tablet Take 1 tablet (800 mg total) by mouth daily as needed. For pain or headache 02/21/14   [name], MD  lubiprostone (AMITIZA) 24 MCG capsule Take 1 capsule (24 mcg total) by mouth 2 (two) times daily with a meal. Patient not taking: Reported on 06/19/2015 05/09/14   [name], MD         Vitamin D, Ergocalciferol, (DRISDOL) 50000 UNITS CAPS capsule Take 1 capsule (50,000 Units total) by mouth every 7 (seven) days. Takes on Thursdays 06/19/15   [name], MD    BP 140/86 mmHg  Pulse 79  Temp(Src) 98.4 F (36.9 C) (Oral)  Resp 18  SpO2 100%  LMP 09/28/2012   Physical Exam  Constitutional: She is oriented to person, place, and time. No distress.  Alert, nicely groomed Frequent coughing during the exam  HENT:  Head: Atraumatic.  Bilateral TMs are moderately dull, right TM is red tinged, no bulging Marked nasal congestion with mucopurulent material present Throat is a little bit red with postnasal drainage evident  Eyes:  Conjugate gaze, no eye redness/drainage  Neck: Neck supple.  Cardiovascular: Normal rate and regular rhythm.   Pulmonary/Chest: No respiratory distress. She has no wheezes. She has no rales.  Coarse but symmetric breath sounds throughout Lots of coughing with deep breath  Abdominal: She exhibits no distension.  Musculoskeletal: Normal range of motion. She exhibits no edema.  No leg swelling  Neurological: She is alert and oriented to person,  place, and time.  Skin: Skin is warm and dry.  No cyanosis  Nursing note and vitals reviewed.   ED Course  Procedures (including critical care time)  none   MDM   1. Acute sinusitis with symptoms > 10 days   2. Acute bronchitis, unspecified organism    New Prescriptions   AMOXICILLIN-CLAVULANATE (AUGMENTIN) 875-125 MG TABLET    Take 1 tablet by mouth 2 (two) times daily.   PREDNISONE (DELTASONE) 50 MG TABLET    Take 1 tablet (50 mg total) by mouth daily.   Recheck or follow up PCP, Dr. Deborra Medina, if not starting to improve in a few days, for new fever greater than 100.5, or increasing phlegm production. Anticipate gradual improvement in cough/congestion over the next 2-3 weeks. Nasacort or Flonase may be helpful in managing residual congestion; if taking an airplane flight, Afrin may be helpful for persistent congestion.     Sherlene Shams, MD 11/27/15 [phone]

## 2015-12-18 ENCOUNTER — Other Ambulatory Visit: Payer: Self-pay | Admitting: Family Medicine

## 2015-12-18 MED ORDER — IBUPROFEN 800 MG PO TABS: 800.0000 mg | ORAL_TABLET | Freq: Every day | ORAL | Status: DC | PRN

## 2015-12-18 MED ORDER — BUPROPION HCL ER (XL) 150 MG PO TB24: 150.0000 mg | ORAL_TABLET | Freq: Every day | ORAL | Status: DC

## 2016-05-28 ENCOUNTER — Other Ambulatory Visit: Payer: Self-pay | Admitting: Family Medicine

## 2016-05-28 DIAGNOSIS — Z1231 Encounter for screening mammogram for malignant neoplasm of breast: Secondary | ICD-10-CM

## 2016-07-06 ENCOUNTER — Ambulatory Visit: Payer: Managed Care, Other (non HMO)

## 2016-07-08 ENCOUNTER — Other Ambulatory Visit: Payer: Self-pay | Admitting: Family Medicine

## 2016-07-08 ENCOUNTER — Ambulatory Visit
Admission: RE | Admit: 2016-07-08 | Discharge: 2016-07-08 | Disposition: A | Discharge status: Home / Self Care | Payer: Managed Care, Other (non HMO) | Source: Ambulatory Visit | Attending: Family Medicine | Admitting: Family Medicine

## 2016-07-08 DIAGNOSIS — Z1231 Encounter for screening mammogram for malignant neoplasm of breast: Secondary | ICD-10-CM | POA: Diagnosis not present

## 2016-10-10 ENCOUNTER — Encounter: Payer: Self-pay | Admitting: *Deleted

## 2016-10-10 ENCOUNTER — Telehealth: Payer: Self-pay | Admitting: *Deleted

## 2016-10-10 ENCOUNTER — Ambulatory Visit
Admission: EM | Admit: 2016-10-10 | Discharge: 2016-10-10 | Disposition: A | Discharge status: Home / Self Care | Payer: Managed Care, Other (non HMO) | Attending: Family Medicine | Admitting: Family Medicine

## 2016-10-10 DIAGNOSIS — J069 Acute upper respiratory infection, unspecified: Secondary | ICD-10-CM

## 2016-10-10 LAB — RAPID STREP SCREEN (MED CTR MEBANE ONLY): Streptococcus, Group A Screen (Direct): NEGATIVE

## 2016-10-10 MED ORDER — FLUTICASONE PROPIONATE 50 MCG/ACT NA SUSP: 2.0000 | Freq: Every day | NASAL | 0 refills | Status: DC

## 2016-10-10 MED ORDER — LIDOCAINE VISCOUS 2 % MT SOLN: 15.0000 mL | Freq: Three times a day (TID) | OROMUCOSAL | 0 refills | Status: DC | PRN

## 2016-10-10 NOTE — Discharge Instructions (Signed)
Take medication as prescribed. Rest. Drink plenty of fluids.  ° °Follow up with your primary care physician this week as needed. Return to Urgent care for new or worsening concerns.  ° °

## 2016-10-10 NOTE — ED Provider Notes (Addendum)
[facility] ____________________________________________  Time seen: Approximately 3:45 PM  I have reviewed the triage vital signs and the nursing notes.   HISTORY  Chief Complaint Sore Throat and Nasal Congestion   HPI Colleen Haley is a 50 y.o. female presenting complaints of runny nose, nasal congestion, cough and sore throat. Patient reports that she started to have a scratchy sore throat last night and other symptoms were present upon awakening this morning. Patient denies any fevers. Reports continues to eat and drink well remaining active. Reports sick contacts at work. Denies home sick contacts. Reports has not taken any medications for the same.  Denies chest pain, shortness of breath, abdominal pain, dysuria, neck pain, back pain, extremity pain, extremity swelling or rash.   Arnette Norris, MD : PCP  Past Medical History:  Diagnosis Date  . Constipation   . Migraines   . Positive TB test   . Stress incontinence, female    Followed by Urology    Patient Active Problem List   Diagnosis Date Noted  . Unspecified vitamin D deficiency 04/26/2014  . Anxiety state 08/30/2013  . Routine general medical examination at a health care facility 03/01/2013  . Dysuria 01/24/2013  . Other and unspecified ovarian cyst 01/24/2013  . Pelvic pain in female 01/24/2013  . 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 Vitamin D deficiency 09/11/2011  . Stress incontinence, female   . Positive TB test     Past Surgical History:  Procedure Laterality Date  . BILATERAL SALPINGECTOMY  10/19/2012   Procedure: BILATERAL SALPINGECTOMY;  Surgeon: Emily Filbert, MD;  Location: [facility];  Service: Gynecology;  Laterality: N/A;  . BLADDER SUSPENSION  10/19/2012   Procedure: TRANSVAGINAL TAPE (TVT) PROCEDURE;  Surgeon: Emily Filbert, MD;  Location: [facility];  Service: Gynecology;  Laterality: N/A;  . CYSTOSCOPY  10/19/2012   Procedure: CYSTOSCOPY;  Surgeon: Emily Filbert, MD;   Location: [facility];  Service: Gynecology;  Laterality: N/A;  . ROBOTIC ASSISTED TOTAL HYSTERECTOMY  10/19/2012   Procedure: ROBOTIC ASSISTED TOTAL HYSTERECTOMY;  Surgeon: Emily Filbert, MD;  Location: [facility];  Service: Gynecology;  Laterality: N/A;  . TUBAL LIGATION  1999   general anesthesia  . VAGINAL DELIVERY  [id number]  hysterectomy  Current Outpatient Rx  . Order #: [id number] Class: Normal  . Order #: [id number] Class: Normal  . Order #: [id number] Class: Normal  . Order #: [id number] Class: Normal  . Order #: [id number] Class: Normal  . Order #: [id number] Class: Normal  . Order #: [id number] Class: Normal    No current facility-administered medications for this encounter.   Current Outpatient Prescriptions:  .  ibuprofen (ADVIL,MOTRIN) 800 MG tablet, Take 1 tablet (800 mg total) by mouth daily as needed. For pain or headache, Disp: 90 tablet, Rfl: 2 .  buPROPion (WELLBUTRIN XL) 150 MG 24 hr tablet, Take 1 tablet (150 mg total) by mouth daily., Disp: 90 tablet, Rfl: 3 .  fluticasone (FLONASE) 50 MCG/ACT nasal spray, Place 2 sprays into both nostrils daily., Disp: 1 g, Rfl: 0 .  lidocaine (XYLOCAINE) 2 % solution, Use as directed 15 mLs in the mouth or throat every 8 (eight) hours as needed for mouth pain., Disp: 85 mL, Rfl: 0 .  lubiprostone (AMITIZA) 24 MCG capsule, Take 1 capsule (24 mcg total) by mouth 2 (two) times daily with a meal. (Patient not taking: Reported on 06/19/2015), Disp: 60 capsule, Rfl: 1 .  predniSONE (DELTASONE) 50 MG tablet, Take 1 tablet (50 mg total) by mouth  daily., Disp: 5 tablet, Rfl: 0 .  Vitamin D, Ergocalciferol, (DRISDOL) 50000 UNITS CAPS capsule, Take 1 capsule (50,000 Units total) by mouth every 7 (seven) days. Takes on Thursdays, Disp: 15 capsule, Rfl: 0  Allergies Patient has no known allergies.  Family History  Problem Relation Age of Onset  . Hyperlipidemia Mother   . Hypertension Mother   . Hyperlipidemia Father   . Hypertension Father   . Glaucoma  Father   . Diabetes Father   . Hyperlipidemia Sister     Social History Social History  Substance Use Topics  . Smoking status: Never Smoker  . Smokeless tobacco: Never Used  . Alcohol use 0.5 oz/week    1 Standard drinks or equivalent per week     Comment: occasional    Review of Systems Constitutional: No fever/chills Eyes: No visual changes. ENT: positive sore throat. Cardiovascular: Denies chest pain. Respiratory: Denies shortness of breath. Gastrointestinal: No abdominal pain.  No nausea, no vomiting.  No diarrhea.  No constipation. Genitourinary: Negative for dysuria. Musculoskeletal: Negative for back pain. Skin: Negative for rash. Neurological: Negative for headaches, focal weakness or numbness.  10-point ROS otherwise negative.  ____________________________________________   PHYSICAL EXAM:  VITAL SIGNS: ED Triage Vitals  Enc Vitals Group     BP 10/10/16 1526 (!) 154/119     Pulse Rate 10/10/16 1526 (!) 102     Resp 10/10/16 1526 16     Temp 10/10/16 1526 98.2 F (36.8 C)     Temp Source 10/10/16 1526 Oral     SpO2 10/10/16 1526 100 %     Weight 10/10/16 1527 175 lb (79.4 kg)     Height 10/10/16 1527 5\' 3"  (1.6 m)     Head Circumference --      Peak Flow --      Pain Score 10/10/16 1530 0     Pain Loc --      Pain Edu? --      Excl. in [place]? --     Today's Vitals   10/10/16 1527 10/10/16 1530 10/10/16 1548 10/10/16 1726  BP:    (!) 147/95  Pulse:    91  Resp:      Temp:      TempSrc:      SpO2:      Weight: 175 lb (79.4 kg)     Height: 5\' 3"  (1.6 m)     PainSc:  0-No pain 0-No pain    Constitutional: Alert and oriented. Well appearing and in no acute distress. Eyes: Conjunctivae are normal. PERRL. EOMI. Head: Atraumatic. No sinus tenderness to palpation. No swelling. No erythema.  Ears: no erythema, normal TMs bilaterally.   Nose:Nasal congestion with clear rhinorrhea  Mouth/Throat: Mucous membranes are moist. Mild pharyngeal erythema. No  tonsillar swelling or exudate.  Neck: No stridor.  No cervical spine tenderness to palpation. Hematological/Lymphatic/Immunilogical: No cervical lymphadenopathy. Cardiovascular: Normal rate, regular rhythm. Grossly normal heart sounds.  Good peripheral circulation. Respiratory: Normal respiratory effort.  No retractions. Lungs CTAB.No wheezes, rales or rhonchi. Good air movement.  Gastrointestinal: Soft and nontender. No CVA tenderness. Musculoskeletal: No lower or upper extremity tenderness nor edema. No cervical, thoracic or lumbar tenderness to palpation. Neurologic:  Normal speech and language. No gross focal neurologic deficits are appreciated. No gait instability. Skin:  Skin is warm, dry and intact. No rash noted. Psychiatric: Mood and affect are normal. Speech and behavior are normal.  ___________________________________________   LABS (all labs ordered are listed, but only  abnormal results are displayed)  Labs Reviewed  RAPID STREP SCREEN (NOT AT [facility])  CULTURE, GROUP A STREP [facility])     PROCEDURES Procedures     INITIAL IMPRESSION / ASSESSMENT AND PLAN / ED COURSE  Pertinent labs & imaging results that were available during my care of the patient were reviewed by me and considered in my medical decision making (see chart for details).  Well-appearing patient. No acute distress. Presents for complaints of 2 days of cough, congestion and sore throat. Quick strep negative, will culture. Suspect viral upper respiratory infection. Encouraged supportive care. Patient requests prescription for flonase, Rx given.PRN viscous lidocaine. Encouraged supportive care, rest, fluids and PCP follow-up as needed. Encouraged monitoring blood pressure at home and follow up with PCP, patient reports blood pressure being monitored by PCP and denies other complaints. Reports will follow up with PCP.   Discussed follow up with Primary care physician this week. Discussed follow up and return  parameters including no resolution or any worsening concerns. Patient verbalized understanding and agreed to plan.   ____________________________________________   FINAL CLINICAL IMPRESSION(S) / ED DIAGNOSES  Final diagnoses:  Upper respiratory tract infection, unspecified type     Discharge Medication List as of 10/10/2016  3:46 PM    START taking these medications   Details  fluticasone (FLONASE) 50 MCG/ACT nasal spray Place 2 sprays into both nostrils daily., Starting Sat 10/10/2016, Until Sat 10/24/2016, Normal    lidocaine (XYLOCAINE) 2 % solution Use as directed 15 mLs in the mouth or throat every 8 (eight) hours as needed (sore throat. gargle and spit as needed for sore throat.)., Starting Sat 10/10/2016, Normal        Note: This dictation was prepared with Dragon dictation along with smaller phrase technology. Any transcriptional errors that result from this process are unintentional.    Clinical Course       Marylene Land, NP 10/11/16 [place], NP 10/11/16 1731

## 2016-10-10 NOTE — ED Triage Notes (Signed)
Patient started having symptoms of nasal congestion, cough, and sore throat.

## 2016-10-13 LAB — CULTURE, GROUP A STREP (THRC)

## 2016-11-26 ENCOUNTER — Other Ambulatory Visit: Payer: Self-pay

## 2016-11-26 MED ORDER — IBUPROFEN 800 MG PO TABS: 800.0000 mg | ORAL_TABLET | Freq: Every day | ORAL | 2 refills | Status: DC | PRN

## 2016-11-26 NOTE — Telephone Encounter (Signed)
Pt left /vm requesting refill ibuprofen [facility]. Last seen annual 06/19/15 and no future appt scheduled. Lasts refilled #90 x 2 on 12/18/15

## 2016-12-06 ENCOUNTER — Ambulatory Visit
Admission: EM | Admit: 2016-12-06 | Discharge: 2016-12-06 | Disposition: A | Discharge status: Home / Self Care | Payer: Managed Care, Other (non HMO) | Attending: Family Medicine | Admitting: Family Medicine

## 2016-12-06 ENCOUNTER — Ambulatory Visit (INDEPENDENT_AMBULATORY_CARE_PROVIDER_SITE_OTHER): Payer: Managed Care, Other (non HMO)

## 2016-12-06 ENCOUNTER — Encounter: Payer: Self-pay | Admitting: Emergency Medicine

## 2016-12-06 DIAGNOSIS — S46311A Strain of muscle, fascia and tendon of triceps, right arm, initial encounter: Secondary | ICD-10-CM | POA: Diagnosis not present

## 2016-12-06 DIAGNOSIS — M79601 Pain in right arm: Secondary | ICD-10-CM | POA: Diagnosis not present

## 2016-12-06 DIAGNOSIS — M778 Other enthesopathies, not elsewhere classified: Secondary | ICD-10-CM

## 2016-12-06 DIAGNOSIS — M779 Enthesopathy, unspecified: Principal | ICD-10-CM

## 2016-12-06 MED ORDER — MELOXICAM 15 MG PO TABS: 15.0000 mg | ORAL_TABLET | Freq: Every day | ORAL | 0 refills | Status: DC

## 2016-12-06 NOTE — ED Triage Notes (Signed)
Patient c/o right upper arm pain for a week.  Patient denies injury or fall.

## 2016-12-06 NOTE — ED Provider Notes (Signed)
[facility]    CSN: [id number] Arrival date & time: 12/06/16  1331     History   Chief Complaint Chief Complaint  Patient presents with  . Arm Pain    right upper arm    HPI Colleen Haley is a 50 y.o. female.   Patient reports pain right shoulder and right upper arm. The pain started a couple weeks ago. She is use heat and other methods and arms like gotten better. One time she thought she might be able to work it out exercising she tried certain things she caught a work it out but that has not helped. She reports still having pain from the axillary area of her right arm down to her elbow. Past smoker history she's had a history of of migraines stress incontinence. She has had 5 hysterectomy. She does not smoke. No known drug allergies, she has been using Motrin without improvement. His history hyperlipidemia hypertension and diabetes in the family.   The history is provided by the patient. No language interpreter was used.  Arm Pain  The current episode started more than 1 week ago. The problem occurs constantly. The problem has been gradually worsening. Pertinent negatives include no chest pain, no abdominal pain, no headaches and no shortness of breath. The symptoms are aggravated by twisting. Nothing relieves the symptoms. She has tried nothing for the symptoms. The treatment provided no relief.    Past Medical History:  Diagnosis Date  . Constipation   . Migraines   . Positive TB test   . Stress incontinence, female    Followed by Urology    Patient Active Problem List   Diagnosis Date Noted  . Unspecified vitamin D deficiency 04/26/2014  . Anxiety state 08/30/2013  . Routine general medical examination at a health care facility 03/01/2013  . Dysuria 01/24/2013  . Other and unspecified ovarian cyst 01/24/2013  . Pelvic pain in female 01/24/2013  . 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 Vitamin D deficiency 09/11/2011  . Stress incontinence,  female   . Positive TB test     Past Surgical History:  Procedure Laterality Date  . BILATERAL SALPINGECTOMY  10/19/2012   Procedure: BILATERAL SALPINGECTOMY;  Surgeon: Emily Filbert, MD;  Location: [facility];  Service: Gynecology;  Laterality: N/A;  . BLADDER SUSPENSION  10/19/2012   Procedure: TRANSVAGINAL TAPE (TVT) PROCEDURE;  Surgeon: Emily Filbert, MD;  Location: [facility];  Service: Gynecology;  Laterality: N/A;  . CYSTOSCOPY  10/19/2012   Procedure: CYSTOSCOPY;  Surgeon: Emily Filbert, MD;  Location: [facility];  Service: Gynecology;  Laterality: N/A;  . ROBOTIC ASSISTED TOTAL HYSTERECTOMY  10/19/2012   Procedure: ROBOTIC ASSISTED TOTAL HYSTERECTOMY;  Surgeon: Emily Filbert, MD;  Location: [facility];  Service: Gynecology;  Laterality: N/A;  . TUBAL LIGATION  1999   general anesthesia  . VAGINAL DELIVERY  1989.1990    OB History    Gravida Para Term Preterm AB Living   2 2 2     2    SAB TAB Ectopic Multiple Live Births                   Home Medications    Prior to Admission medications   Medication Sig Start Date End Date Taking? Authorizing Provider  ibuprofen (ADVIL,MOTRIN) 800 MG tablet Take 1 tablet (800 mg total) by mouth daily as needed. For pain or headache 11/26/16   [name], MD  meloxicam (MOBIC) 15 MG tablet  Take 1 tablet (15 mg total) by mouth daily. 12/06/16   Frederich Cha, MD    Family History Family History  Problem Relation Age of Onset  . Hyperlipidemia Mother   . Hypertension Mother   . Hyperlipidemia Father   . Hypertension Father   . Glaucoma Father   . Diabetes Father   . Hyperlipidemia Sister     Social History Social History  Substance Use Topics  . Smoking status: Never Smoker  . Smokeless tobacco: Never Used  . Alcohol use 0.5 oz/week    1 Standard drinks or equivalent per week     Comment: occasional     Allergies   Patient has no known allergies.   Review of Systems Review of Systems  Respiratory: Negative for shortness of breath.     Cardiovascular: Negative for chest pain.  Gastrointestinal: Negative for abdominal pain.  Musculoskeletal: Positive for arthralgias and myalgias.  Neurological: Negative for headaches.  All other systems reviewed and are negative.    Physical Exam Triage Vital Signs ED Triage Vitals  Enc Vitals Group     BP 12/06/16 1347 (!) 156/83     Pulse Rate 12/06/16 1347 78     Resp 12/06/16 1347 16     Temp 12/06/16 1347 98.2 F (36.8 C)     Temp Source 12/06/16 1347 Oral     SpO2 12/06/16 1347 100 %     Weight 12/06/16 1344 178 lb (80.7 kg)     Height 12/06/16 1344 5\' 3"  (1.6 m)     Head Circumference --      Peak Flow --      Pain Score 12/06/16 1346 6     Pain Loc --      Pain Edu? --      Excl. in [place]? --    No data found.   Updated Vital Signs BP (!) 156/83 (BP Location: Left Arm)   Pulse 78   Temp 98.2 F (36.8 C) (Oral)   Resp 16   Ht 5\' 3"  (1.6 m)   Wt 178 lb (80.7 kg)   LMP 09/10/2012   SpO2 100%   BMI 31.53 kg/m   Visual Acuity Right Eye Distance:   Left Eye Distance:   Bilateral Distance:    Right Eye Near:   Left Eye Near:    Bilateral Near:     Physical Exam  Constitutional: She is oriented to person, place, and time. She appears well-developed and well-nourished.  HENT:  Head: Normocephalic and atraumatic.  Eyes: Pupils are equal, round, and reactive to light.  Neck: Normal range of motion.  Pulmonary/Chest: Effort normal.  Musculoskeletal: Normal range of motion. She exhibits tenderness. She exhibits no edema or deformity.  Neurological: She is alert and oriented to person, place, and time.  Skin: Skin is warm.  Psychiatric: She has a normal mood and affect.  Vitals reviewed.    UC Treatments / Results  Labs (all labs ordered are listed, but only abnormal results are displayed) Labs Reviewed - No data to display  EKG  EKG Interpretation None       Radiology Dg Shoulder Right  Result Date: 12/06/2016 CLINICAL DATA:  Pain when  moving shoulder certain ways for the past 1-2 months. EXAM: RIGHT SHOULDER - 2+ VIEW COMPARISON:  None. FINDINGS: No fracture or dislocation. Glenohumeral and acromioclavicular joint spaces appear preserved. No evidence of calcific tendinitis. Limited visualization of the adjacent thorax is normal. Regional soft tissues appear normal. IMPRESSION: Normal radiographs  of the right shoulder. Electronically Signed   By: Sandi Mariscal M.D.   On: 12/06/2016 15:30    Procedures Procedures (including critical care time)  Medications Ordered in UC Medications - No data to display   Initial Impression / Assessment and Plan / UC Course  I have reviewed the triage vital signs and the nursing notes.  Pertinent labs & imaging results that were available during my care of the patient were reviewed by me and considered in my medical decision making (see chart for details).  Clinical Course    I informed patient concerned about damage to the right rotator cuff as well because of her inability to use his pressure which has her arm raised and thumb pointing down. She is able to raise arm above her head but this would indicate some damage of the rotator cuff as well. X-ray was negative as far as any fracture or obvious impingement but did recommend orthopedic evaluation if the socket better and also Mobic 15 mg 1 tablet a day for pain and discomfort  Final Clinical Impressions(s) / UC Diagnoses   Final diagnoses:  Right arm pain  Strain of right triceps tendon, initial encounter  Triceps tendonitis    New Prescriptions New Prescriptions   MELOXICAM (MOBIC) 15 MG TABLET    Take 1 tablet (15 mg total) by mouth daily.    Note: This dictation was prepared with Dragon dictation along with smaller phrase technology. Any transcriptional errors that result from this process are unintentional.   Frederich Cha, MD 12/06/16 [phone]

## 2017-02-07 NOTE — ED Provider Notes (Signed)
Ms. Guisinger has had hoarseness and recurrent coughing she requested possible week place on something for her cough will give her a prescription for Tussionex 4 fluid ounces 1 teaspoon twice a day   Frederich Cha, MD 02/07/17 1501

## 2017-03-15 DIAGNOSIS — M7541 Impingement syndrome of right shoulder: Secondary | ICD-10-CM | POA: Insufficient documentation

## 2017-03-17 ENCOUNTER — Telehealth: Payer: Self-pay

## 2017-03-17 NOTE — Telephone Encounter (Signed)
Pt will be traveling to [place] and needs typhoid vaccine; advised pt to contact health dept travel group or ck cdc. Pt voiced understanding.

## 2017-04-05 ENCOUNTER — Telehealth: Payer: Self-pay

## 2017-04-05 MED ORDER — SCOPOLAMINE 1 MG/3DAYS TD PT72: 1.0000 | MEDICATED_PATCH | TRANSDERMAL | 12 refills | Status: DC

## 2017-04-05 NOTE — Telephone Encounter (Signed)
Scop patch eRx sent.  Have a great trip!

## 2017-04-05 NOTE — Telephone Encounter (Signed)
Pt left v/m; pt will be traveling to [place]; pt has all immunizations needed. Pt request med for motion sickness. [facility] [address].pt request cb. Last annual 06/19/15.

## 2017-04-05 NOTE — Telephone Encounter (Signed)
Spoke to pt

## 2017-08-03 ENCOUNTER — Other Ambulatory Visit: Payer: Self-pay | Admitting: Family Medicine

## 2017-12-10 ENCOUNTER — Other Ambulatory Visit: Payer: Self-pay | Admitting: Family Medicine

## 2017-12-10 ENCOUNTER — Telehealth: Payer: Self-pay

## 2017-12-10 DIAGNOSIS — Z1239 Encounter for other screening for malignant neoplasm of breast: Secondary | ICD-10-CM

## 2017-12-10 NOTE — Telephone Encounter (Signed)
TA-Pt has appt with you on 1.9.18/she is requesting a referral for Mammogram for [facility] to be put in system/plz advise/thx dmf   Copied from [place] [phone]. Topic: General - Other >> Dec 10, 2017 11:36 AM Carolyn Stare wrote:  Pt call to ask for an order to be placed in Epic so that she can go to [facility] for her [id number]    [phone]

## 2017-12-10 NOTE — Telephone Encounter (Signed)
Order placed

## 2017-12-15 ENCOUNTER — Ambulatory Visit (INDEPENDENT_AMBULATORY_CARE_PROVIDER_SITE_OTHER): Payer: Managed Care, Other (non HMO) | Admitting: Family Medicine

## 2017-12-15 ENCOUNTER — Encounter: Payer: Self-pay | Admitting: Internal Medicine

## 2017-12-15 ENCOUNTER — Encounter: Payer: Self-pay | Admitting: Family Medicine

## 2017-12-15 VITALS — BP 132/90 | HR 86 | Temp 98.6°F | Ht 63.5 in | Wt 181.8 lb

## 2017-12-15 DIAGNOSIS — Z1231 Encounter for screening mammogram for malignant neoplasm of breast: Secondary | ICD-10-CM | POA: Diagnosis not present

## 2017-12-15 DIAGNOSIS — Z1239 Encounter for other screening for malignant neoplasm of breast: Secondary | ICD-10-CM

## 2017-12-15 DIAGNOSIS — Z0000 Encounter for general adult medical examination without abnormal findings: Secondary | ICD-10-CM | POA: Diagnosis not present

## 2017-12-15 DIAGNOSIS — Z1211 Encounter for screening for malignant neoplasm of colon: Secondary | ICD-10-CM | POA: Diagnosis not present

## 2017-12-15 LAB — COMPREHENSIVE METABOLIC PANEL
ALT: 16 U/L (ref 0–35)
AST: 9 U/L (ref 0–37)
Albumin: 4.3 g/dL (ref 3.5–5.2)
Alkaline Phosphatase: 68 U/L (ref 39–117)
BILIRUBIN TOTAL: 0.7 mg/dL (ref 0.2–1.2)
BUN: 9 mg/dL (ref 6–23)
CO2: 24 mEq/L (ref 19–32)
CREATININE: 0.67 mg/dL (ref 0.40–1.20)
Calcium: 9.9 mg/dL (ref 8.4–10.5)
Chloride: 105 mEq/L (ref 96–112)
GFR: 119.74 mL/min (ref >60.00)
GLUCOSE: 88 mg/dL (ref 70–99)
Potassium: 3.9 mEq/L (ref 3.5–5.1)
SODIUM: 138 meq/L (ref 135–145)
Total Protein: 6.7 g/dL (ref 6.0–8.3)

## 2017-12-15 LAB — CBC WITH DIFFERENTIAL/PLATELET
BASOS ABS: 0.1 10*3/uL (ref 0.0–0.1)
Basophils Relative: 0.5 % (ref 0.0–3.0)
EOS ABS: 0.1 10*3/uL (ref 0.0–0.7)
Eosinophils Relative: 1.1 % (ref 0.0–5.0)
HCT: 40.6 % (ref 36.0–46.0)
Hemoglobin: 13.1 g/dL (ref 12.0–15.0)
LYMPHS ABS: 2.1 10*3/uL (ref 0.7–4.0)
Lymphocytes Relative: 19 % (ref 12.0–46.0)
MCHC: 32.3 g/dL (ref 30.0–36.0)
MCV: 92 fl (ref 78.0–100.0)
MONOS PCT: 5 % (ref 3.0–12.0)
Monocytes Absolute: 0.5 10*3/uL (ref 0.1–1.0)
NEUTROS PCT: 74.4 % (ref 43.0–77.0)
Neutro Abs: 8.1 10*3/uL — ABNORMAL HIGH (ref 1.4–7.7)
Platelets: 211 10*3/uL (ref 150.0–400.0)
RBC: 4.41 Mil/uL (ref 3.87–5.11)
RDW: 13.1 % (ref 11.5–15.5)
WBC: 10.9 10*3/uL — ABNORMAL HIGH (ref 4.0–10.5)

## 2017-12-15 LAB — LIPID PANEL
Cholesterol: 193 mg/dL (ref 0–200)
HDL: 40 mg/dL (ref >39.00)
LDL Cholesterol: 132 mg/dL — ABNORMAL HIGH (ref 0–99)
NONHDL: 152.85
Total CHOL/HDL Ratio: 5
Triglycerides: 103 mg/dL (ref 0.0–149.0)
VLDL: 20.6 mg/dL (ref 0.0–40.0)

## 2017-12-15 LAB — TSH: TSH: 0.73 u[IU]/mL (ref 0.35–4.50)

## 2017-12-15 LAB — VITAMIN D 25 HYDROXY (VIT D DEFICIENCY, FRACTURES): VITD: 16.28 ng/mL — ABNORMAL LOW (ref 30.00–100.00)

## 2017-12-15 NOTE — Patient Instructions (Signed)
Great to see you. We will call you with your lab results and you can view them online.  Please stop by to see [facility] on your way out.

## 2017-12-15 NOTE — Assessment & Plan Note (Signed)
Reviewed preventive care protocols, scheduled due services, and updated immunizations Discussed nutrition, exercise, diet, and healthy lifestyle.  Orders Placed This Encounter  Procedures  . MM Digital Screening  . CBC with Differential/Platelet  . Comprehensive metabolic panel  . Lipid panel  . TSH  . Vitamin D (25 hydroxy)  . Ambulatory referral to Gastroenterology

## 2017-12-15 NOTE — Progress Notes (Signed)
Subjective:    Patient ID: Colleen Haley, female    DOB: [birth year], 52 y.o.   MRN: [id number]  HPI  52 yo female here for CPX.  G2P2.   No h/o abnormal pap smears. S/p hysterectomy with bladder sling in 10/2012.  Feels much better.  Urinary incontinence has improved as well.    Mammogram 07/08/2016. Has never had a colonoscopy.  Doing well.  Enjoying work and raising her 57 year old grand daughter.  Wt Readings from Last 3 Encounters:  12/15/17 181 lb 12.8 oz (82.5 kg)  12/06/16 178 lb (80.7 kg)  10/10/16 175 lb (79.4 kg)   H/o Vit D deficiency- has been more tired lately.  Lab Results  Component Value Date   CHOL 228 (H) 06/19/2015   HDL 41 06/19/2015   LDLCALC 153 (H) 06/19/2015   TRIG 172 (H) 06/19/2015   CHOLHDL 5.6 (H) 06/19/2015   Lab Results  Component Value Date   WBC 10.2 06/19/2015   HGB 13.0 06/19/2015   HCT 39.9 06/19/2015   MCV 90 06/19/2015   PLT 189 06/19/2015   Lab Results  Component Value Date   CREATININE 0.65 06/19/2015     Patient Active Problem List   Diagnosis Date Noted  . Unspecified vitamin D deficiency 04/26/2014  . Anxiety state 08/30/2013  . Routine general medical examination at a health care facility 03/01/2013  . Dysuria 01/24/2013  . Other and unspecified ovarian cyst 01/24/2013  . Pelvic pain in female 01/24/2013  . 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 Vitamin D deficiency 09/11/2011  . Stress incontinence, female   . Positive TB test    Past Medical History:  Diagnosis Date  . Constipation   . Migraines   . Positive TB test   . Stress incontinence, female    Followed by Urology   Past Surgical History:  Procedure Laterality Date  . BILATERAL SALPINGECTOMY  10/19/2012   Procedure: BILATERAL SALPINGECTOMY;  Surgeon: Emily Filbert, MD;  Location: [facility];  Service: Gynecology;  Laterality: N/A;  . BLADDER SUSPENSION  10/19/2012   Procedure: TRANSVAGINAL TAPE (TVT) PROCEDURE;  Surgeon: Emily Filbert, MD;   Location: [facility];  Service: Gynecology;  Laterality: N/A;  . CYSTOSCOPY  10/19/2012   Procedure: CYSTOSCOPY;  Surgeon: Emily Filbert, MD;  Location: [facility];  Service: Gynecology;  Laterality: N/A;  . ROBOTIC ASSISTED TOTAL HYSTERECTOMY  10/19/2012   Procedure: ROBOTIC ASSISTED TOTAL HYSTERECTOMY;  Surgeon: Emily Filbert, MD;  Location: [facility];  Service: Gynecology;  Laterality: N/A;  . TUBAL LIGATION  1999   general anesthesia  . VAGINAL DELIVERY  [phone]   Social History   Tobacco Use  . Smoking status: Never Smoker  . Smokeless tobacco: Never Used  Substance Use Topics  . Alcohol use: Yes    Alcohol/week: 0.5 oz    Types: 1 Standard drinks or equivalent per week    Comment: occasional  . Drug use: No   Family History  Problem Relation Age of Onset  . Hyperlipidemia Mother   . Hypertension Mother   . Hyperlipidemia Father   . Hypertension Father   . Glaucoma Father   . Diabetes Father   . Hyperlipidemia Sister    No Known Allergies Current Outpatient Medications on File Prior to Visit  Medication Sig Dispense Refill  . ibuprofen (ADVIL,MOTRIN) 800 MG tablet TAKE 1 TABLET BY MOUTH  DAILY AS NEEDED. FOR PAIN  OR HEADACHE 90 tablet 1   No current  facility-administered medications on file prior to visit.    The PMH, PSH, Social History, Family History, Medications, and allergies have been reviewed in [facility], and have been updated if relevant.   Review of Systems Review of Systems  Constitutional: Negative for fatigue and unexpected weight change.  HENT: Negative.   Respiratory: Negative.   Cardiovascular: Negative.   Gastrointestinal: Negative.   Endocrine: Negative.   Genitourinary: Negative.   Musculoskeletal: Negative.   Skin: Negative.   Neurological: Negative.   Psychiatric/Behavioral: Negative for decreased concentration, dysphoric mood, hallucinations, self-injury, sleep disturbance and suicidal ideas. The patient is not nervous/anxious and is not hyperactive.    All other systems reviewed and are negative.      Objective:   Physical Exam BP 132/90 (BP Location: Left Arm, Patient Position: Sitting, Cuff Size: Normal)   Pulse 86   Temp 98.6 F (37 C) (Oral)   Ht 5' 3.5" (1.613 m)   Wt 181 lb 12.8 oz (82.5 kg)   LMP 09/28/2012   SpO2 (!) 88%   BMI 31.70 kg/m    General:  Well-developed,well-nourished,in no acute distress; alert,appropriate and cooperative throughout examination Head:  normocephalic and atraumatic.   Eyes:  vision grossly intact, PERRL Ears:  R ear normal and L ear normal externally, TMs clear bilaterally Nose:  no external deformity.   Mouth:  good dentition.   Neck:  No deformities, masses, or tenderness noted. Breasts:  No mass, nodules, thickening, tenderness, bulging, retraction, inflamation, nipple discharge or skin changes noted.   Lungs:  Normal respiratory effort, chest expands symmetrically. Lungs are clear to auscultation, no crackles or wheezes. Heart:  Normal rate and regular rhythm. S1 and S2 normal without gallop, murmur, click, rub or other extra sounds. Abdomen:  Bowel sounds positive,abdomen soft and non-tender without masses, organomegaly or hernias noted. Msk:  No deformity or scoliosis noted of thoracic or lumbar spine.   Extremities:  No clubbing, cyanosis, edema, or deformity noted with normal full range of motion of all joints.   Neurologic:  alert & oriented X3 and gait normal.   Skin:  Intact without suspicious lesions or rashes Cervical Nodes:  No lymphadenopathy noted Axillary Nodes:  No palpable lymphadenopathy Psych:  Cognition and judgment appear intact. Alert and cooperative with normal attention span and concentration. No apparent delusions, illusions, hallucinations   Assessment & Plan:

## 2017-12-16 ENCOUNTER — Telehealth: Payer: Self-pay

## 2017-12-16 ENCOUNTER — Other Ambulatory Visit: Payer: Self-pay

## 2017-12-16 DIAGNOSIS — E559 Vitamin D deficiency, unspecified: Secondary | ICD-10-CM

## 2017-12-16 DIAGNOSIS — Z1211 Encounter for screening for malignant neoplasm of colon: Secondary | ICD-10-CM

## 2017-12-16 MED ORDER — VITAMIN D (ERGOCALCIFEROL) 1.25 MG (50000 UNIT) PO CAPS: 50000.0000 [IU] | ORAL_CAPSULE | ORAL | 0 refills | Status: AC

## 2017-12-16 NOTE — Progress Notes (Signed)
Pt agrees to start Colleen Haley 50k 1qwk then take OTC Colleen Haley 1,6k IU qd and recheck level in 8-12 weeks/sent in Rx/created future order for lab/she will call to sched lab visit/thx dmf

## 2017-12-16 NOTE — Telephone Encounter (Signed)
-----   Message from [name], MD sent at 12/16/2017  7:58 AM EST ----- Please let pt know that her cholesterol, cbc, thyroid function, liver function, kidney function and electrolytes look excellent. Keep up the good work. Vit Synai Prettyman is low- Vit D3 50,000 units x 6 weeks - 1 tab po weekly x 6 weeks (number 6, no refills), then continue VitD 1600 IU daily.  Recheck in 8-12 weeks (268.0)

## 2017-12-16 NOTE — Telephone Encounter (Signed)
Gastroenterology Pre-Procedure Review  Request Date: 12/31/17 Requesting Physician: Dr. Allen Norris  PATIENT REVIEW QUESTIONS: The patient responded to the following health history questions as indicated:    1. Are you having any GI issues? no 2. Do you have a personal history of Polyps? no 3. Do you have a family history of Colon Cancer or Polyps? no 4. Diabetes Mellitus? no 5. Joint replacements in the past 12 months?no 6. Major health problems in the past 3 months?no 7. Any artificial heart valves, MVP, or defibrillator?no    MEDICATIONS & ALLERGIES:    Patient reports the following regarding taking any anticoagulation/antiplatelet therapy:   Plavix, Coumadin, Eliquis, Xarelto, Lovenox, Pradaxa, Brilinta, or Effient? no Aspirin? no  Patient confirms/reports the following medications:  Current Outpatient Medications  Medication Sig Dispense Refill  . ibuprofen (ADVIL,MOTRIN) 800 MG tablet TAKE 1 TABLET BY MOUTH  DAILY AS NEEDED. FOR PAIN  OR HEADACHE 90 tablet 1   No current facility-administered medications for this visit.     Patient confirms/reports the following allergies:  No Known Allergies  No orders of the defined types were placed in this encounter.   AUTHORIZATION INFORMATION Primary Insurance: 1D#: Group #:  Secondary Insurance: 1D#: Group #:  SCHEDULE INFORMATION: Date: 12/31/17 Time: Location:[facility]

## 2017-12-16 NOTE — Telephone Encounter (Signed)
Pt agrees to start Vit-Lyvia Mondesir 50k 1qwk then take OTC Vit-Dorla Guizar 1,6k IU qd and recheck level in 8-12 weeks/sent in Rx/created future order for lab/she will call to sched lab visit/thx dmf

## 2017-12-24 ENCOUNTER — Other Ambulatory Visit: Payer: Self-pay

## 2017-12-30 ENCOUNTER — Ambulatory Visit
Admission: RE | Admit: 2017-12-30 | Discharge: 2017-12-30 | Disposition: A | Discharge status: Home / Self Care | Payer: Managed Care, Other (non HMO) | Source: Ambulatory Visit | Attending: Family Medicine | Admitting: Family Medicine

## 2017-12-30 DIAGNOSIS — Z1239 Encounter for other screening for malignant neoplasm of breast: Secondary | ICD-10-CM

## 2017-12-30 DIAGNOSIS — Z1231 Encounter for screening mammogram for malignant neoplasm of breast: Secondary | ICD-10-CM | POA: Diagnosis not present

## 2017-12-30 NOTE — Discharge Instructions (Signed)
General Anesthesia, Adult, Care After °These instructions provide you with information about caring for yourself after your procedure. Your health care provider may also give you more specific instructions. Your treatment has been planned according to current medical practices, but problems sometimes occur. Call your health care provider if you have any problems or questions after your procedure. °What can I expect after the procedure? °After the procedure, it is common to have: °· Vomiting. °· A sore throat. °· Mental slowness. ° °It is common to feel: °· Nauseous. °· Cold or shivery. °· Sleepy. °· Tired. °· Sore or achy, even in parts of your body where you did not have surgery. ° °Follow these instructions at home: °For at least 24 hours after the procedure: °· Do not: °? Participate in activities where you could fall or become injured. °? Drive. °? Use heavy machinery. °? Drink alcohol. °? Take sleeping pills or medicines that cause drowsiness. °? Make important decisions or sign legal documents. °? Take care of children on your own. °· Rest. °Eating and drinking °· If you vomit, drink water, juice, or soup when you can drink without vomiting. °· Drink enough fluid to keep your urine clear or pale yellow. °· Make sure you have little or no nausea before eating solid foods. °· Follow the diet recommended by your health care provider. °General instructions °· Have a responsible adult stay with you until you are awake and alert. °· Return to your normal activities as told by your health care provider. Ask your health care provider what activities are safe for you. °· Take over-the-counter and prescription medicines only as told by your health care provider. °· If you smoke, do not smoke without supervision. °· Keep all follow-up visits as told by your health care provider. This is important. °Contact a health care provider if: °· You continue to have nausea or vomiting at home, and medicines are not helpful. °· You  cannot drink fluids or start eating again. °· You cannot urinate after 8-12 hours. °· You develop a skin rash. °· You have fever. °· You have increasing redness at the site of your procedure. °Get help right away if: °· You have difficulty breathing. °· You have chest pain. °· You have unexpected bleeding. °· You feel that you are having a life-threatening or urgent problem. °This information is not intended to replace advice given to you by your health care provider. Make sure you discuss any questions you have with your health care provider. °Document Released: 03/01/2001 Document Revised: 04/27/2016 Document Reviewed: 11/07/2015 °Elsevier Interactive Patient Education © 2018 Elsevier Inc. ° °

## 2017-12-31 ENCOUNTER — Ambulatory Visit: Payer: Managed Care, Other (non HMO) | Admitting: Anesthesiology

## 2017-12-31 ENCOUNTER — Encounter
Admission: RE | Disposition: A | Discharge status: Home / Self Care | Payer: Self-pay | Source: Ambulatory Visit | Attending: Gastroenterology

## 2017-12-31 ENCOUNTER — Ambulatory Visit
Admission: RE | Admit: 2017-12-31 | Discharge: 2017-12-31 | Disposition: A | Discharge status: Home / Self Care | Payer: Managed Care, Other (non HMO) | Source: Ambulatory Visit | Attending: Gastroenterology | Admitting: Gastroenterology

## 2017-12-31 DIAGNOSIS — Z1211 Encounter for screening for malignant neoplasm of colon: Secondary | ICD-10-CM

## 2017-12-31 DIAGNOSIS — F419 Anxiety disorder, unspecified: Secondary | ICD-10-CM | POA: Insufficient documentation

## 2017-12-31 DIAGNOSIS — K640 First degree hemorrhoids: Secondary | ICD-10-CM | POA: Diagnosis not present

## 2017-12-31 DIAGNOSIS — Z79899 Other long term (current) drug therapy: Secondary | ICD-10-CM | POA: Insufficient documentation

## 2017-12-31 DIAGNOSIS — Z1231 Encounter for screening mammogram for malignant neoplasm of breast: Secondary | ICD-10-CM

## 2017-12-31 HISTORY — PX: COLONOSCOPY WITH PROPOFOL: SHX5780

## 2017-12-31 SURGERY — COLONOSCOPY WITH PROPOFOL
Anesthesia: General | Site: Rectum | Wound class: Contaminated

## 2017-12-31 MED ORDER — STERILE WATER FOR IRRIGATION IR SOLN
Status: DC | PRN
  Administered 2017-12-31: 09:00:00

## 2017-12-31 MED ORDER — LACTATED RINGERS IV SOLN
1000.0000 mL | INTRAVENOUS | Status: DC
  Administered 2017-12-31: 1000 mL via INTRAVENOUS

## 2017-12-31 MED ORDER — LIDOCAINE HCL (CARDIAC) 20 MG/ML IV SOLN
INTRAVENOUS | Status: DC | PRN
  Administered 2017-12-31: 30 mg via INTRAVENOUS

## 2017-12-31 MED ORDER — PROPOFOL 10 MG/ML IV BOLUS
INTRAVENOUS | Status: DC | PRN
  Administered 2017-12-31: 50 mg via INTRAVENOUS
  Administered 2017-12-31: 80 mg via INTRAVENOUS
  Administered 2017-12-31: 40 mg via INTRAVENOUS
  Administered 2017-12-31: 20 mg via INTRAVENOUS
  Administered 2017-12-31: 50 mg via INTRAVENOUS
  Administered 2017-12-31: 40 mg via INTRAVENOUS

## 2017-12-31 MED ORDER — LACTATED RINGERS IV SOLN: INTRAVENOUS | Status: DC

## 2017-12-31 SURGICAL SUPPLY — 24 items

## 2017-12-31 NOTE — H&P (Signed)
Lucilla Lame, MD [address] [place], [place] [address] Phone: [phone] Fax : [phone]  Primary Care Physician:  [name], MD Primary Gastroenterologist:  Dr. Allen Norris  Pre-Procedure History & Physical: HPI:  Colleen Haley is a 52 y.o. female is here for a screening colonoscopy.   Past Medical History:  Diagnosis Date  . Constipation   . Migraines   . Positive TB test   . Stress incontinence, female    Followed by Urology    Past Surgical History:  Procedure Laterality Date  . BILATERAL SALPINGECTOMY  10/19/2012   Procedure: BILATERAL SALPINGECTOMY;  Surgeon: Emily Filbert, MD;  Location: [facility];  Service: Gynecology;  Laterality: N/A;  . BLADDER SUSPENSION  10/19/2012   Procedure: TRANSVAGINAL TAPE (TVT) PROCEDURE;  Surgeon: Emily Filbert, MD;  Location: [facility];  Service: Gynecology;  Laterality: N/A;  . CYSTOSCOPY  10/19/2012   Procedure: CYSTOSCOPY;  Surgeon: Emily Filbert, MD;  Location: [facility];  Service: Gynecology;  Laterality: N/A;  . ROBOTIC ASSISTED TOTAL HYSTERECTOMY  10/19/2012   Procedure: ROBOTIC ASSISTED TOTAL HYSTERECTOMY;  Surgeon: Emily Filbert, MD;  Location: [facility];  Service: Gynecology;  Laterality: N/A;  . TUBAL LIGATION  1999   general anesthesia  . VAGINAL DELIVERY  1989.1990    Prior to Admission medications   Medication Sig Start Date End Date Taking? Authorizing Provider  esomeprazole (NEXIUM) 20 MG packet Take 20 mg by mouth daily before breakfast.   Yes [provider]  ibuprofen (ADVIL,MOTRIN) 800 MG tablet TAKE 1 TABLET BY MOUTH  DAILY AS NEEDED. FOR PAIN  OR HEADACHE 08/04/17   [name], MD  Vitamin D, Ergocalciferol, (DRISDOL) 50000 units CAPS capsule Take 1 capsule (50,000 Units total) by mouth every 7 (seven) days. 12/16/17 01/27/18  [name], MD    Allergies as of 12/16/2017  . (No Known Allergies)    Family History  Problem Relation Age of Onset  . Hyperlipidemia Mother   . Hypertension Mother     . Hyperlipidemia Father   . Hypertension Father   . Glaucoma Father   . Diabetes Father   . Hyperlipidemia Sister     Social History   Socioeconomic History  . Marital status: Married    Spouse name: Not on file  . Number of children: Not on file  . Years of education: Not on file  . Highest education level: Not on file  Social Needs  . Financial resource strain: Not on file  . Food insecurity - worry: Not on file  . Food insecurity - inability: Not on file  . Transportation needs - medical: Not on file  . Transportation needs - non-medical: Not on file  Occupational History  . Not on file  Tobacco Use  . Smoking status: Never Smoker  . Smokeless tobacco: Never Used  Substance and Sexual Activity  . Alcohol use: Yes    Alcohol/week: 0.5 oz    Types: 1 Standard drinks or equivalent per week    Comment: occasional  . Drug use: No  . Sexual activity: Not Currently    Partners: Male  Other Topics Concern  . Not on file  Social History Narrative   CMA.   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    Review of Systems: See HPI, otherwise negative ROS  Physical Exam: BP 116/64   Pulse 82   Temp 98.6 F (37 C) (Temporal)   Resp 16   Ht 5\' 3"  (1.6  m)   Wt 182 lb (82.6 kg)   LMP 09/28/2012   SpO2 100%   BMI 32.24 kg/m  General:   Alert,  pleasant and cooperative in NAD Head:  Normocephalic and atraumatic. Neck:  Supple; no masses or thyromegaly. Lungs:  Clear throughout to auscultation.    Heart:  Regular rate and rhythm. Abdomen:  Soft, nontender and nondistended. Normal bowel sounds, without guarding, and without rebound.   Neurologic:  Alert and  oriented x4;  grossly normal neurologically.  Impression/Plan: Colleen Haley is now here to undergo a screening colonoscopy.  Risks, benefits, and alternatives regarding colonoscopy have been reviewed with the patient.  Questions have been answered.  All parties agreeable.

## 2017-12-31 NOTE — Anesthesia Preprocedure Evaluation (Signed)
Anesthesia Evaluation  Patient identified by MRN, date of birth, ID band Patient awake    Reviewed: Allergy & Precautions, NPO status , Patient's Chart, lab work & pertinent test results  History of Anesthesia Complications Negative for: history of anesthetic complications  Airway Mallampati: III  TM Distance: >3 FB Neck ROM: Full    Dental  (+)    Pulmonary neg pulmonary ROS,    Pulmonary exam normal breath sounds clear to auscultation       Cardiovascular Exercise Tolerance: Good negative cardio ROS Normal cardiovascular exam Rhythm:Regular Rate:Normal     Neuro/Psych  Headaches, PSYCHIATRIC DISORDERS Anxiety    GI/Hepatic negative GI ROS,   Endo/Other  negative endocrine ROS  Renal/GU negative Renal ROS     Musculoskeletal   Abdominal   Peds  Hematology negative hematology ROS (+)   Anesthesia Other Findings   Reproductive/Obstetrics                             Anesthesia Physical Anesthesia Plan  ASA: II  Anesthesia Plan: General   Post-op Pain Management:    Induction: Intravenous  PONV Risk Score and Plan: 2 and Propofol infusion and TIVA  Airway Management Planned: Natural Airway  Additional Equipment:   Intra-op Plan:   Post-operative Plan:   Informed Consent: I have reviewed the patients History and Physical, chart, labs and discussed the procedure including the risks, benefits and alternatives for the proposed anesthesia with the patient or authorized representative who has indicated his/her understanding and acceptance.     Plan Discussed with: CRNA  Anesthesia Plan Comments:         Anesthesia Quick Evaluation

## 2017-12-31 NOTE — Anesthesia Procedure Notes (Signed)
Procedure Name: MAC Performed by: Lind Guest, CRNA Pre-anesthesia Checklist: Patient identified, Emergency Drugs available, Suction available, Patient being monitored and Timeout performed Patient Re-evaluated:Patient Re-evaluated prior to induction Oxygen Delivery Method: Nasal cannula

## 2017-12-31 NOTE — Transfer of Care (Signed)
Immediate Anesthesia Transfer of Care Note  Patient: Colleen Haley  Procedure(s) Performed: COLONOSCOPY WITH PROPOFOL (N/A Rectum)  Patient Location: PACU  Anesthesia Type: General  Level of Consciousness: awake, alert  and patient cooperative  Airway and Oxygen Therapy: Patient Spontanous Breathing and Patient connected to supplemental oxygen  Post-op Assessment: Post-op Vital signs reviewed, Patient's Cardiovascular Status Stable, Respiratory Function Stable, Patent Airway and No signs of Nausea or vomiting  Post-op Vital Signs: Reviewed and stable  Complications: No apparent anesthesia complications

## 2017-12-31 NOTE — Op Note (Signed)
[facility] Gastroenterology Patient Name: Colleen Haley Procedure Date: 12/31/2017 8:25 AM MRN: [id number] Account #: [id number] Date of Birth: [birth year] Admit Type: Outpatient Age: 52 Room: [facility] OR ROOM 01 Gender: Female Note Status: Finalized Procedure:            Colonoscopy Indications:          Screening for colorectal malignant neoplasm Providers:            Lucilla Lame MD, MD Referring MD:         Marciano Sequin. Deborra Medina (Referring MD) Medicines:            Propofol per Anesthesia Complications:        No immediate complications. Procedure:            Pre-Anesthesia Assessment:                       - Prior to the procedure, a History and Physical was                        performed, and patient medications and allergies were                        reviewed. The patient's tolerance of previous                        anesthesia was also reviewed. The risks and benefits of                        the procedure and the sedation options and risks were                        discussed with the patient. All questions were                        answered, and informed consent was obtained. Prior                        Anticoagulants: The patient has taken no previous                        anticoagulant or antiplatelet agents. ASA Grade                        Assessment: II - A patient with mild systemic disease.                        After reviewing the risks and benefits, the patient was                        deemed in satisfactory condition to undergo the                        procedure.                       After obtaining informed consent, the colonoscope was                        passed under direct vision. Throughout the procedure,  the patient's blood pressure, pulse, and oxygen                        saturations were monitored continuously. The Olympus CF                        H180AL Colonoscope (S#: [id number]) was introduced through                         the anus and advanced to the the cecum, identified by                        appendiceal orifice and ileocecal valve. The                        colonoscopy was performed without difficulty. The                        patient tolerated the procedure well. The quality of                        the bowel preparation was excellent. Findings:      The perianal and digital rectal examinations were normal.      Non-bleeding internal hemorrhoids were found during retroflexion. The       hemorrhoids were Grade I (internal hemorrhoids that do not prolapse).      The exam was otherwise without abnormality. Impression:           - Non-bleeding internal hemorrhoids.                       - The examination was otherwise normal.                       - No specimens collected. Recommendation:       - Discharge patient to home.                       - Resume previous diet.                       - Continue present medications.                       - Repeat colonoscopy in 10 years for screening unless                        any change in family history or lower GI problems. Procedure Code(s):    --- Professional ---                       [phone], Colonoscopy, flexible; diagnostic, including                        collection of specimen(s) by brushing or washing, when                        performed (separate procedure) Diagnosis Code(s):    --- Professional ---                       Z12.11, Encounter for screening for malignant neoplasm  of colon CPT copyright 2016 American Medical Association. All rights reserved. The codes documented in this report are preliminary and upon coder review may  be revised to meet current compliance requirements. Lucilla Lame MD, MD 12/31/2017 8:43:35 AM This report has been signed electronically. Number of Addenda: 0 Note Initiated On: 12/31/2017 8:25 AM Scope Withdrawal Time: 0 hours 7 minutes 7 seconds  Total Procedure  Duration: 0 hours 11 minutes 11 seconds       [facility]

## 2017-12-31 NOTE — Anesthesia Postprocedure Evaluation (Signed)
Anesthesia Post Note  Patient: Colleen Haley  Procedure(s) Performed: COLONOSCOPY WITH PROPOFOL (N/A Rectum)  Patient location during evaluation: PACU Anesthesia Type: General Level of consciousness: awake and alert, oriented and patient cooperative Pain management: pain level controlled Vital Signs Assessment: post-procedure vital signs reviewed and stable Respiratory status: spontaneous breathing, nonlabored ventilation and respiratory function stable Cardiovascular status: blood pressure returned to baseline and stable Postop Assessment: adequate PO intake Anesthetic complications: no    Darrin Nipper

## 2018-01-03 ENCOUNTER — Encounter: Payer: Self-pay | Admitting: Gastroenterology

## 2018-02-16 ENCOUNTER — Encounter: Payer: Managed Care, Other (non HMO) | Admitting: Internal Medicine

## 2018-08-22 ENCOUNTER — Other Ambulatory Visit: Payer: Self-pay | Admitting: Family Medicine

## 2018-12-24 ENCOUNTER — Other Ambulatory Visit: Payer: Self-pay | Admitting: Family Medicine

## 2018-12-29 IMAGING — MG MM DIGITAL SCREENING BILAT W/ TOMO W/ CAD
9 of 12 series · 9 of 28 positions shown · non-contrast
Comparison: Previous exam(s).

CLINICAL DATA: Screening.

EXAM:
2D DIGITAL SCREENING BILATERAL MAMMOGRAM WITH 3D TOMO WITH CAD

[L CC synth-2D]
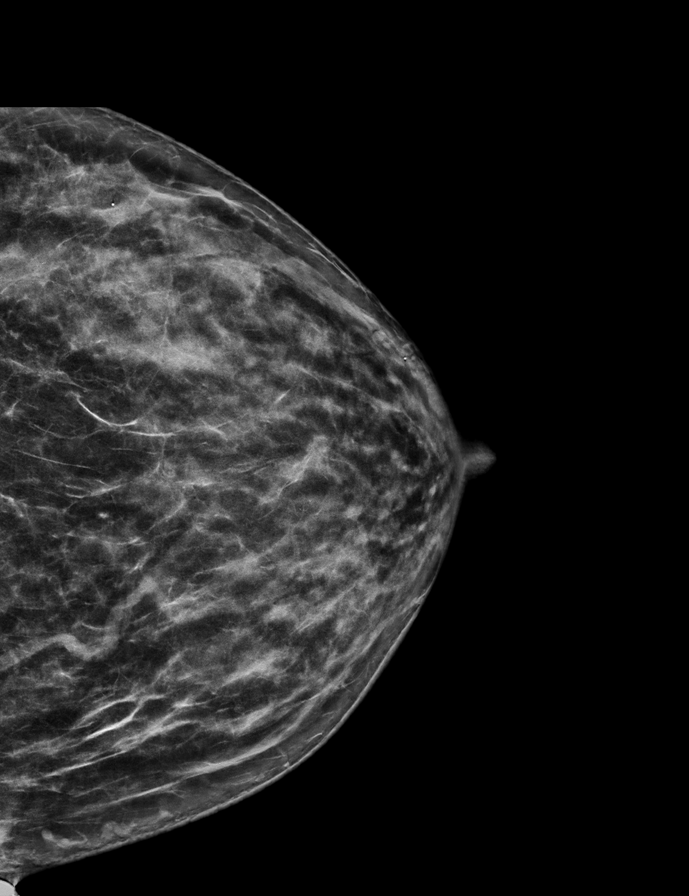

[R CC synth-2D]
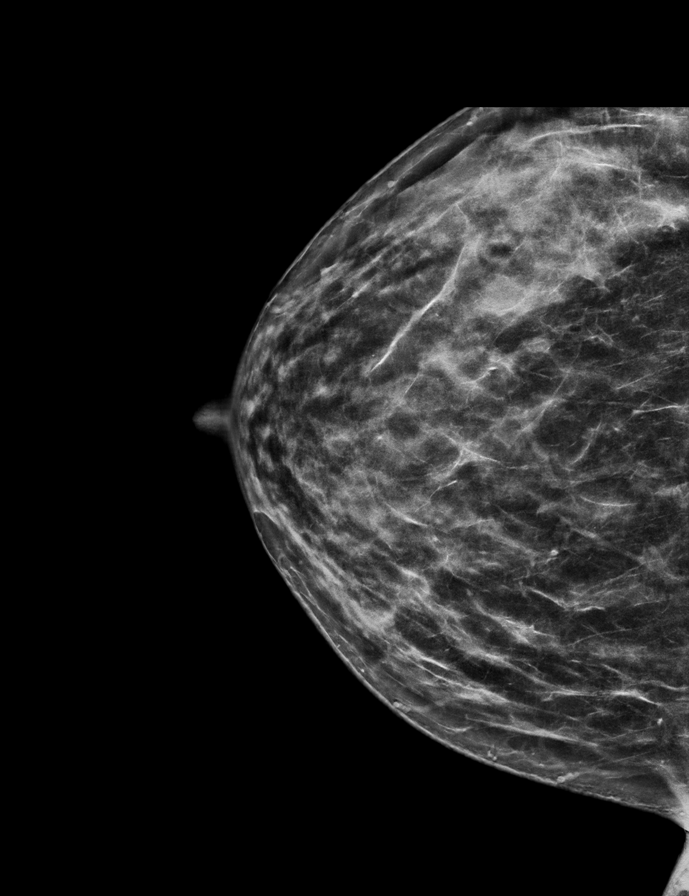

[R CC]
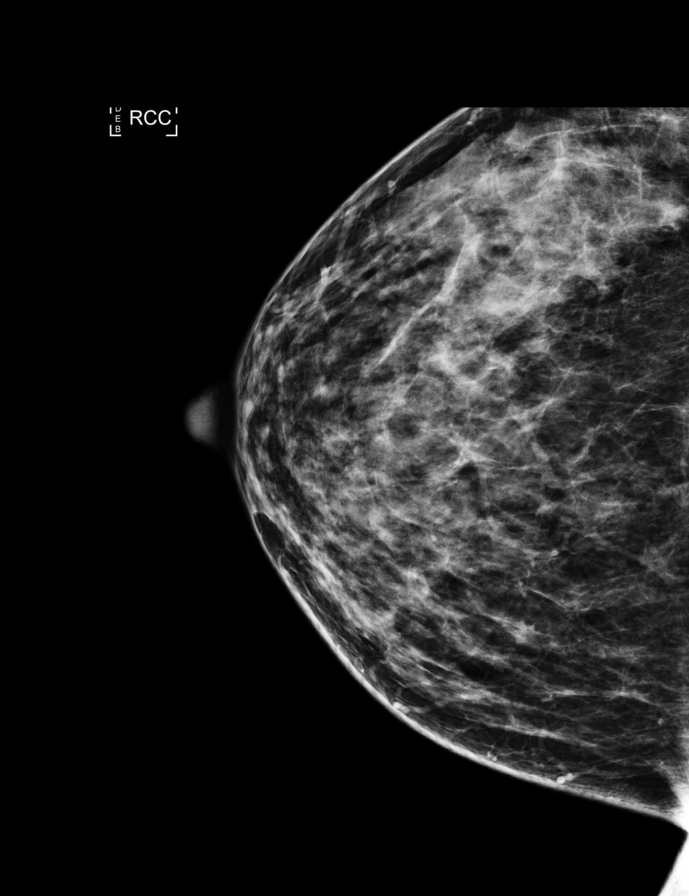

[R MLO]
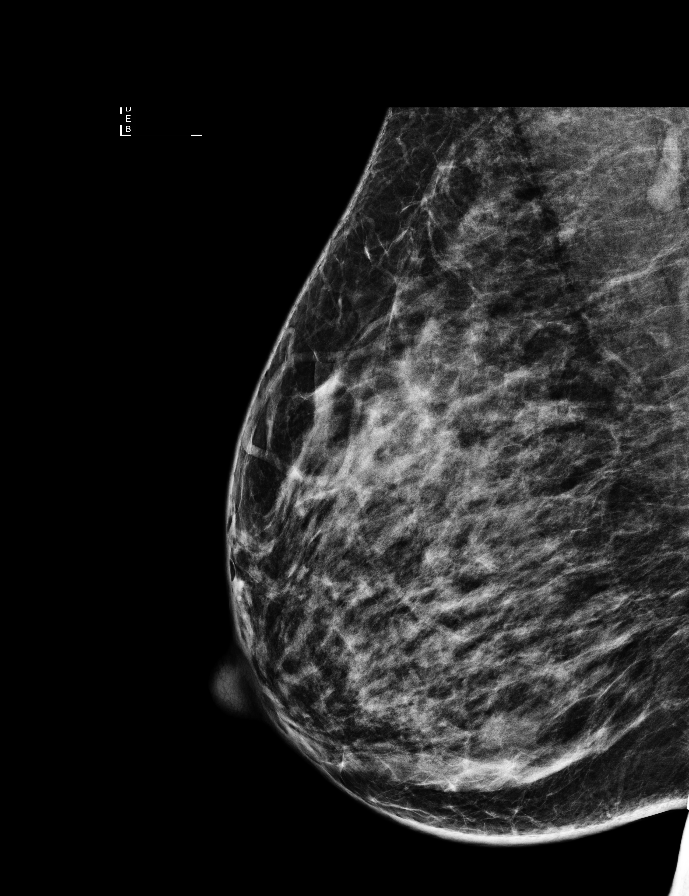

[L CC]
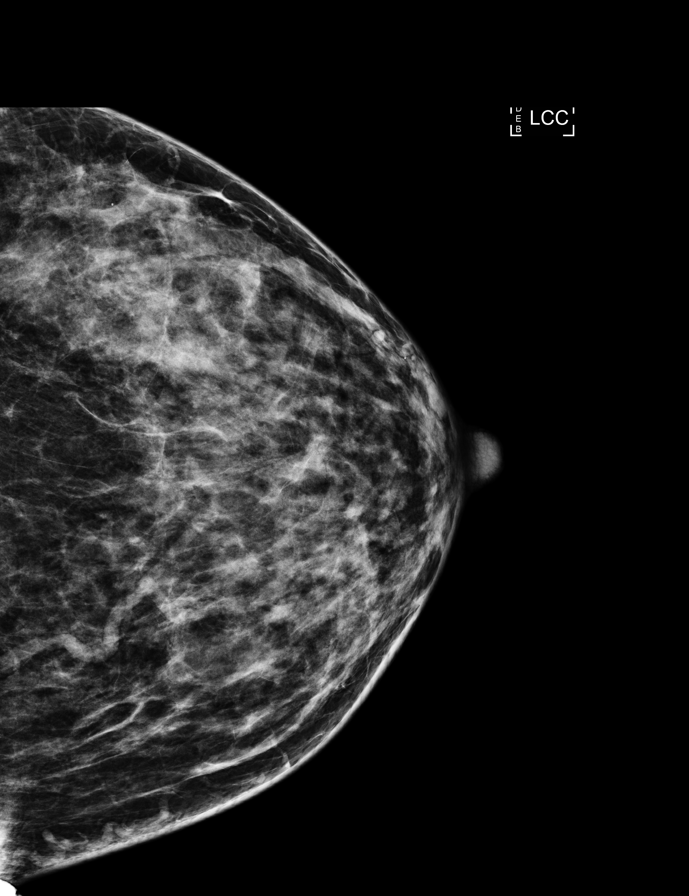

[L MLO]
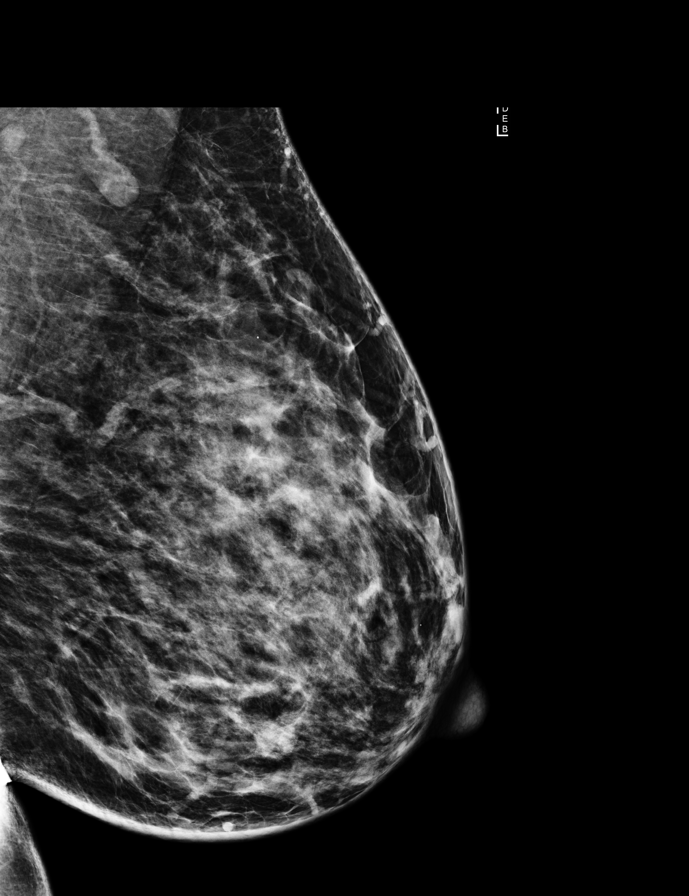

[L MLO synth-2D]
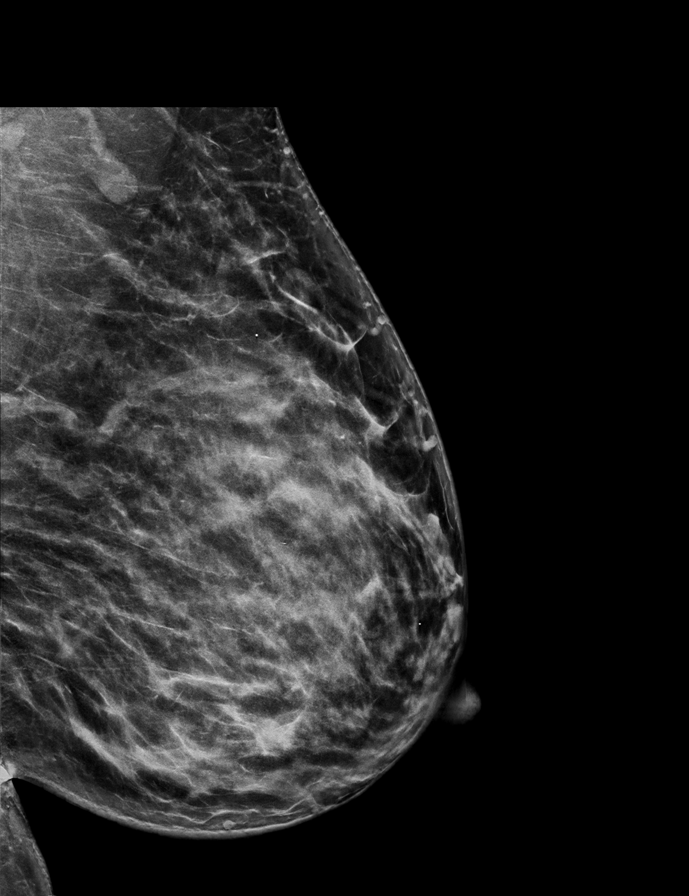

[R MLO synth-2D]
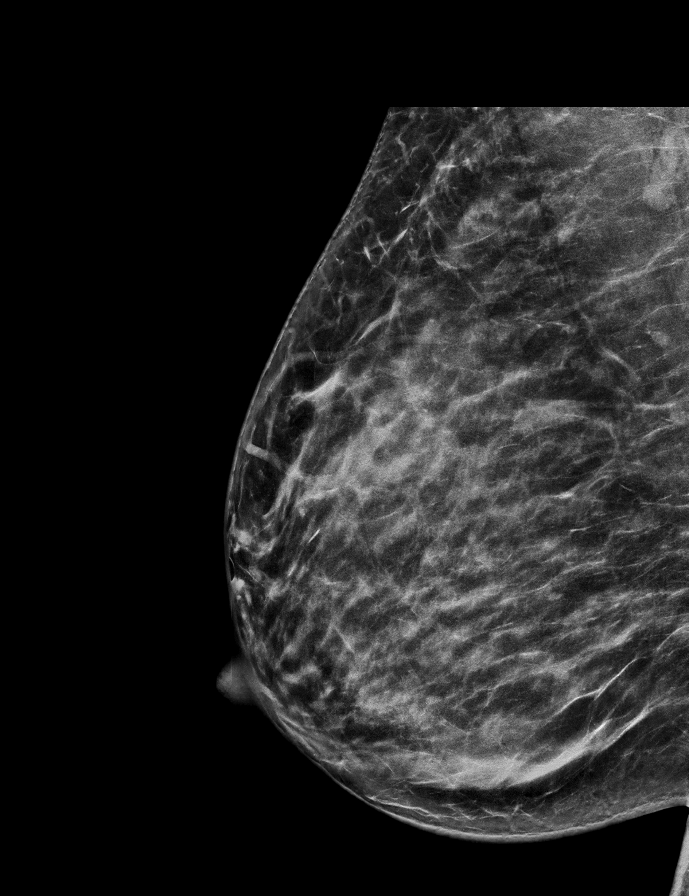

[L CC tomo · tomo slice 35/68.0]
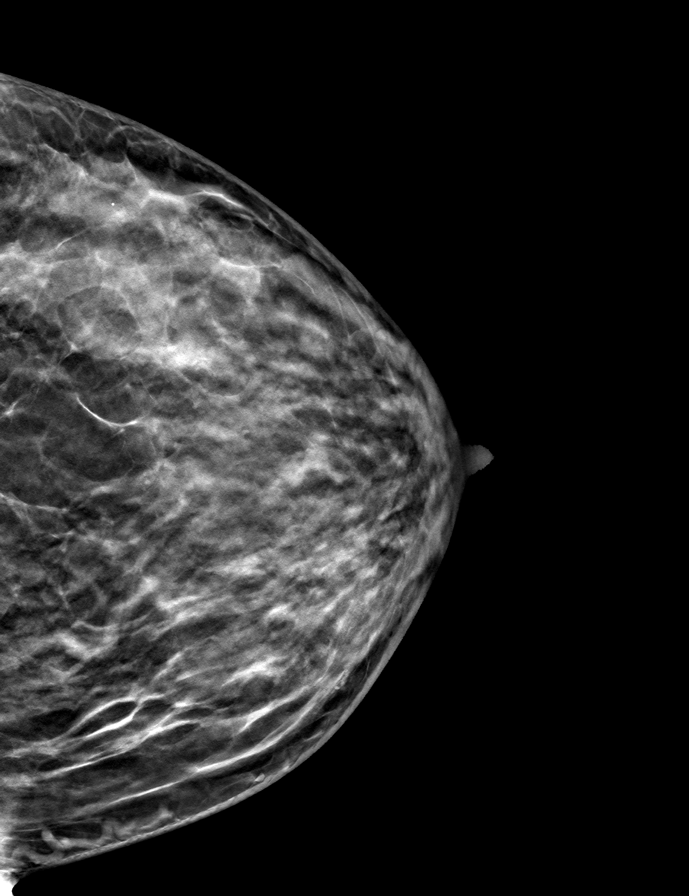

[9 of 28 positions shown; findings below may reference images not displayed]

ACR Breast Density Category c: The breast tissue is heterogeneously
dense, which may obscure small masses.
FINDINGS: There are no findings suspicious for malignancy. Images were
processed with CAD.
IMPRESSION: No mammographic evidence of malignancy. A result letter of this
screening mammogram will be mailed directly to the patient.

RECOMMENDATION:
Screening mammogram in one year. (Code:UA-9-KQN)

BI-RADS CATEGORY  1: Negative.

## 2019-02-10 ENCOUNTER — Telehealth: Payer: Self-pay | Admitting: Family Medicine

## 2019-02-10 DIAGNOSIS — Z1239 Encounter for other screening for malignant neoplasm of breast: Secondary | ICD-10-CM

## 2019-02-10 NOTE — Telephone Encounter (Signed)
Copied from [place] [phone]. Topic: Referral - Request for Referral >> Feb 09, 2019 12:07 PM Scherrie Gerlach wrote: Pt needs order for her mammogram sent to  [facility]

## 2019-02-13 NOTE — Telephone Encounter (Signed)
Mammogram ordered

## 2019-02-13 NOTE — Telephone Encounter (Signed)
Okay for mammogram order?

## 2019-03-23 ENCOUNTER — Encounter: Payer: Managed Care, Other (non HMO) | Admitting: Family Medicine

## 2019-05-18 ENCOUNTER — Encounter: Payer: Managed Care, Other (non HMO) | Admitting: Family Medicine

## 2019-06-13 ENCOUNTER — Other Ambulatory Visit: Payer: Self-pay | Admitting: Family Medicine

## 2019-06-22 ENCOUNTER — Other Ambulatory Visit: Payer: Self-pay

## 2019-06-22 ENCOUNTER — Ambulatory Visit
Admission: RE | Admit: 2019-06-22 | Discharge: 2019-06-22 | Disposition: A | Discharge status: Home / Self Care | Payer: Managed Care, Other (non HMO) | Source: Ambulatory Visit | Attending: Family Medicine | Admitting: Family Medicine

## 2019-06-22 DIAGNOSIS — Z1231 Encounter for screening mammogram for malignant neoplasm of breast: Secondary | ICD-10-CM | POA: Insufficient documentation

## 2019-06-22 DIAGNOSIS — Z1239 Encounter for other screening for malignant neoplasm of breast: Secondary | ICD-10-CM

## 2019-07-19 ENCOUNTER — Other Ambulatory Visit: Payer: Self-pay

## 2019-07-19 DIAGNOSIS — Z0000 Encounter for general adult medical examination without abnormal findings: Secondary | ICD-10-CM

## 2019-07-19 DIAGNOSIS — E559 Vitamin D deficiency, unspecified: Secondary | ICD-10-CM

## 2019-07-19 DIAGNOSIS — F411 Generalized anxiety disorder: Secondary | ICD-10-CM

## 2019-07-19 DIAGNOSIS — E785 Hyperlipidemia, unspecified: Secondary | ICD-10-CM

## 2019-07-19 NOTE — Progress Notes (Signed)
Per TA faxed labs to [facility] at [phone]/advised pt that she needs a virtual visit within 2 weeks of having them drawn/she will call back and schedule/thx dmf

## 2019-07-19 NOTE — Progress Notes (Signed)
Please ask her to schedule virutal visit after we get her results back.

## 2019-07-20 ENCOUNTER — Other Ambulatory Visit: Payer: Self-pay | Admitting: Family Medicine

## 2019-07-21 LAB — COMPREHENSIVE METABOLIC PANEL
ALT: 17 IU/L (ref 0–32)
AST: 13 IU/L (ref 0–40)
Albumin/Globulin Ratio: 2 (ref 1.2–2.2)
Albumin: 4.6 g/dL (ref 3.8–4.9)
Alkaline Phosphatase: 93 IU/L (ref 39–117)
BUN/Creatinine Ratio: 18 (ref 9–23)
BUN: 14 mg/dL (ref 6–24)
Bilirubin Total: <0.2 mg/dL (ref 0.0–1.2)
CO2: 24 mmol/L (ref 20–29)
Calcium: 10.5 mg/dL — ABNORMAL HIGH (ref 8.7–10.2)
Chloride: 109 mmol/L — ABNORMAL HIGH (ref 96–106)
Creatinine, Ser: 0.77 mg/dL (ref 0.57–1.00)
GFR calc Af Amer: 103 mL/min/{1.73_m2} (ref >59)
GFR calc non Af Amer: 90 mL/min/{1.73_m2} (ref >59)
Globulin, Total: 2.3 g/dL (ref 1.5–4.5)
Glucose: 96 mg/dL (ref 65–99)
Potassium: 4.2 mmol/L (ref 3.5–5.2)
Sodium: 144 mmol/L (ref 134–144)
Total Protein: 6.9 g/dL (ref 6.0–8.5)

## 2019-07-21 LAB — CBC WITH DIFFERENTIAL/PLATELET
Basophils Absolute: 0.1 10*3/uL (ref 0.0–0.2)
Basos: 1 %
EOS (ABSOLUTE): 0.4 10*3/uL (ref 0.0–0.4)
Eos: 5 %
Hematocrit: 42.7 % (ref 34.0–46.6)
Hemoglobin: 14 g/dL (ref 11.1–15.9)
Immature Grans (Abs): 0 10*3/uL (ref 0.0–0.1)
Immature Granulocytes: 0 %
Lymphocytes Absolute: 2 10*3/uL (ref 0.7–3.1)
Lymphs: 25 %
MCH: 29.9 pg (ref 26.6–33.0)
MCHC: 32.8 g/dL (ref 31.5–35.7)
MCV: 91 fL (ref 79–97)
Monocytes Absolute: 0.5 10*3/uL (ref 0.1–0.9)
Monocytes: 6 %
Neutrophils Absolute: 5 10*3/uL (ref 1.4–7.0)
Neutrophils: 63 %
Platelets: 203 10*3/uL (ref 150–450)
RBC: 4.68 x10E6/uL (ref 3.77–5.28)
RDW: 12.6 % (ref 11.7–15.4)
WBC: 7.9 10*3/uL (ref 3.4–10.8)

## 2019-07-21 LAB — LIPID PANEL
Chol/HDL Ratio: 5.1 ratio — ABNORMAL HIGH (ref 0.0–4.4)
Cholesterol, Total: 220 mg/dL — ABNORMAL HIGH (ref 100–199)
HDL: 43 mg/dL (ref >39)
LDL Calculated: 140 mg/dL — ABNORMAL HIGH (ref 0–99)
Triglycerides: 187 mg/dL — ABNORMAL HIGH (ref 0–149)
VLDL Cholesterol Cal: 37 mg/dL (ref 5–40)

## 2019-07-21 LAB — VITAMIN D 25 HYDROXY (VIT D DEFICIENCY, FRACTURES): Vit D, 25-Hydroxy: 19.1 ng/mL — ABNORMAL LOW (ref 30.0–100.0)

## 2019-07-21 LAB — TSH: TSH: 1.42 u[IU]/mL (ref 0.450–4.500)

## 2019-07-25 ENCOUNTER — Telehealth: Payer: Self-pay

## 2019-07-25 MED ORDER — VITAMIN D (ERGOCALCIFEROL) 1.25 MG (50000 UNIT) PO CAPS: 50000.0000 [IU] | ORAL_CAPSULE | ORAL | 1 refills | Status: AC

## 2019-07-25 NOTE — Telephone Encounter (Signed)
Colleen Haley states that she has been taking vit-Tawanna Funk for years 1qwk and it never gets it up/should it be increased to twice weekly? ([facility])  Yes has been having a lot of heartburn lately so she has had to take tums frequently/She is going to adjust her diet and decrease the tums and if that does not work then she will call and ask for something to help with that  Plz advise/about the Vit-Nachman Sundt as the last time it was low end of normal was 6-years-ago/thx dmf

## 2019-07-25 NOTE — Telephone Encounter (Signed)
LMOVM (ok per pt) advising to take Vit-Zollie Ellery OTC 2k IU qd with the Rx 1qwk and to call back with any questions/thx dmf

## 2019-07-25 NOTE — Telephone Encounter (Signed)
-----   Message from [name], MD sent at 07/25/2019 10:16 AM EDT ----- Please call pt- 1.   Vitamin Colleen Haley is low which can make you very tired and achy.  Please call in Vit D3 50,000 units x 12 weeks - 1 tab po weekly x 12 weeks (number 12, no refills), then continue VitD 2000 IU daily.  Recheck in 8-12 weeks. 2.  Cbc, liver function, kidney function, blood sugar look great.  Calcium a bit high- has she been taking tums or anything high in calcium? 3.  Cholesterol looks okay.  LDL a bit high- please send information on low cholesterol diet.

## 2019-07-25 NOTE — Telephone Encounter (Signed)
Let's add daily 2000 IU to her weekly 50000 IU dosage.

## 2019-11-26 ENCOUNTER — Other Ambulatory Visit: Payer: Self-pay | Admitting: Family Medicine

## 2019-11-27 NOTE — Telephone Encounter (Signed)
Last OV 12/15/2017 Last fill 06/14/19  #90/1

## 2019-12-12 ENCOUNTER — Other Ambulatory Visit: Payer: Self-pay | Admitting: Family Medicine

## 2019-12-13 NOTE — Telephone Encounter (Signed)
Last OV 12/15/17 Last fill 06/14/19  #90/1 Pt need a follow up visit before any refills.

## 2020-01-25 ENCOUNTER — Encounter: Payer: Managed Care, Other (non HMO) | Admitting: Family Medicine

## 2020-02-17 ENCOUNTER — Other Ambulatory Visit: Payer: Self-pay

## 2020-02-17 ENCOUNTER — Ambulatory Visit (HOSPITAL_COMMUNITY)
Admission: EM | Admit: 2020-02-17 | Discharge: 2020-02-17 | Disposition: A | Discharge status: Home / Self Care | Payer: Managed Care, Other (non HMO) | Attending: Urgent Care | Admitting: Urgent Care

## 2020-02-17 DIAGNOSIS — R030 Elevated blood-pressure reading, without diagnosis of hypertension: Secondary | ICD-10-CM | POA: Diagnosis present

## 2020-02-17 DIAGNOSIS — N3090 Cystitis, unspecified without hematuria: Secondary | ICD-10-CM | POA: Insufficient documentation

## 2020-02-17 DIAGNOSIS — M545 Low back pain, unspecified: Secondary | ICD-10-CM

## 2020-02-17 DIAGNOSIS — R350 Frequency of micturition: Secondary | ICD-10-CM | POA: Insufficient documentation

## 2020-02-17 MED ORDER — CEPHALEXIN 500 MG PO CAPS: 500.0000 mg | ORAL_CAPSULE | Freq: Two times a day (BID) | ORAL | 0 refills | Status: DC

## 2020-02-17 NOTE — ED Triage Notes (Signed)
Seen  by provider only
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
## 2020-02-19 LAB — URINE CULTURE: Culture: <10000 — AB

## 2020-03-12 ENCOUNTER — Telehealth: Payer: Managed Care, Other (non HMO) | Admitting: Physician Assistant

## 2020-03-12 DIAGNOSIS — R300 Dysuria: Secondary | ICD-10-CM

## 2020-03-12 NOTE — Progress Notes (Signed)
Good morning Debrah,   I am sorry you are not feeling well.  I see where you were treated for a UTI with Keflex last month at an urgent care.  Your urine culture did not grow bacteria.  Since you are experiencing similar symptoms, I suggest you provide a urine sample to have a culture performed.  Contact your PCP's office to see if you can have it done there. If not, you can go to one of our urgent care locations.   You should definitely follow-up with your PCP, as there are other conditions which can cause these kinds of symptoms.    Based on what you shared with me, I feel your condition warrants further evaluation and I recommend that you be seen for a face to face visit.  Please contact your primary care physician practice to be seen. Many offices offer virtual options to be seen via video if you are not comfortable going in person to a medical facility at this time.   If you do not have a PCP, [facility] offers a free physician referral service available at [phone]. Our trained staff has the experience, knowledge and resources to put you in touch with a physician who is right for you.   You also have the option of a video visit through [email or web address]  If you are having a true medical emergency please call 911.  NOTE: If you entered your credit card information for this eVisit, you will not be charged. You may see a "hold" on your card for the $35 but that hold will drop off and you will not have a charge processed.  Your e-visit answers were reviewed by a board certified advanced clinical practitioner to complete your personal care plan.  Thank you for using e-Visits.

## 2020-03-18 ENCOUNTER — Encounter: Payer: Managed Care, Other (non HMO) | Admitting: Family Medicine

## 2020-06-26 ENCOUNTER — Ambulatory Visit (INDEPENDENT_AMBULATORY_CARE_PROVIDER_SITE_OTHER): Payer: Managed Care, Other (non HMO)

## 2020-06-26 ENCOUNTER — Other Ambulatory Visit: Payer: Self-pay

## 2020-06-26 ENCOUNTER — Ambulatory Visit
Admission: EM | Admit: 2020-06-26 | Discharge: 2020-06-26 | Disposition: A | Discharge status: Home / Self Care | Payer: Managed Care, Other (non HMO) | Attending: Family Medicine | Admitting: Family Medicine

## 2020-06-26 DIAGNOSIS — S39012A Strain of muscle, fascia and tendon of lower back, initial encounter: Secondary | ICD-10-CM | POA: Diagnosis not present

## 2020-06-26 MED ORDER — CYCLOBENZAPRINE HCL 10 MG PO TABS
10.0000 mg | ORAL_TABLET | Freq: Every evening | ORAL | 0 refills | Status: DC | PRN
Start: 2020-06-26 — End: 2020-07-12

## 2020-06-26 MED ORDER — MELOXICAM 15 MG PO TABS: 15.0000 mg | ORAL_TABLET | Freq: Every day | ORAL | 0 refills | Status: DC | PRN
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
## 2020-06-26 NOTE — Discharge Instructions (Addendum)
Take medication as prescribed. Stretch. Monitor.   Follow up with your primary care physician this week as needed. Return to Urgent care for new or worsening concerns.
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
## 2020-07-12 ENCOUNTER — Encounter: Payer: Self-pay | Admitting: Family Medicine

## 2020-07-12 ENCOUNTER — Other Ambulatory Visit: Payer: Self-pay

## 2020-07-12 ENCOUNTER — Ambulatory Visit: Payer: Managed Care, Other (non HMO) | Admitting: Family Medicine

## 2020-07-12 VITALS — BP 160/100 | HR 92 | Ht 63.0 in | Wt 192.6 lb

## 2020-07-12 DIAGNOSIS — S39012A Strain of muscle, fascia and tendon of lower back, initial encounter: Secondary | ICD-10-CM

## 2020-07-12 DIAGNOSIS — S060X0A Concussion without loss of consciousness, initial encounter: Secondary | ICD-10-CM

## 2020-07-12 DIAGNOSIS — M62838 Other muscle spasm: Secondary | ICD-10-CM

## 2020-07-12 MED ORDER — NORTRIPTYLINE HCL 25 MG PO CAPS: 25.0000 mg | ORAL_CAPSULE | Freq: Every day | ORAL | 2 refills | Status: DC
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
## 2020-08-02 ENCOUNTER — Other Ambulatory Visit: Payer: Self-pay

## 2020-08-02 ENCOUNTER — Encounter: Payer: Self-pay | Admitting: Family Medicine

## 2020-08-02 ENCOUNTER — Ambulatory Visit (INDEPENDENT_AMBULATORY_CARE_PROVIDER_SITE_OTHER): Payer: Managed Care, Other (non HMO) | Admitting: Family Medicine

## 2020-08-02 ENCOUNTER — Other Ambulatory Visit: Payer: Self-pay | Admitting: Family Medicine

## 2020-08-02 VITALS — BP 160/90 | HR 98 | Ht 63.0 in | Wt 193.2 lb

## 2020-08-02 DIAGNOSIS — S060X0D Concussion without loss of consciousness, subsequent encounter: Secondary | ICD-10-CM

## 2020-08-02 MED ORDER — TOPIRAMATE 25 MG PO TABS: 25.0000 mg | ORAL_TABLET | Freq: Two times a day (BID) | ORAL | 1 refills | Status: DC
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
## 2020-08-02 NOTE — Patient Instructions (Signed)
Thank you for coming in today. Try topamax twice daily.  Ok to increase to 50mg  twice daily if 25mg  (1 pill) is not enough.  Let me know if you do this so I can prescribe enough medicine  Recheck in 1 month if needed.  If not doing well ok to contact me or return sooner.   If I need to revise your forms let me know.   Continue PT.   OK to extend if needed.

## 2020-09-04 ENCOUNTER — Other Ambulatory Visit: Payer: Self-pay

## 2020-09-05 ENCOUNTER — Ambulatory Visit (INDEPENDENT_AMBULATORY_CARE_PROVIDER_SITE_OTHER): Payer: Managed Care, Other (non HMO) | Admitting: Family Medicine

## 2020-09-05 ENCOUNTER — Encounter: Payer: Self-pay | Admitting: Family Medicine

## 2020-09-05 VITALS — BP 122/78 | HR 79 | Temp 97.2°F | Ht 63.0 in | Wt 189.0 lb

## 2020-09-05 DIAGNOSIS — E559 Vitamin D deficiency, unspecified: Secondary | ICD-10-CM | POA: Diagnosis not present

## 2020-09-05 DIAGNOSIS — E7800 Pure hypercholesterolemia, unspecified: Secondary | ICD-10-CM

## 2020-09-05 DIAGNOSIS — Z1231 Encounter for screening mammogram for malignant neoplasm of breast: Secondary | ICD-10-CM | POA: Diagnosis not present

## 2020-09-05 DIAGNOSIS — Z23 Encounter for immunization: Secondary | ICD-10-CM | POA: Insufficient documentation

## 2020-09-05 DIAGNOSIS — Z0000 Encounter for general adult medical examination without abnormal findings: Secondary | ICD-10-CM | POA: Diagnosis not present

## 2020-09-05 NOTE — Patient Instructions (Addendum)
Health Maintenance, Female Adopting a healthy lifestyle and getting preventive care are important in promoting health and wellness. Ask your health care provider about:  The right schedule for you to have regular tests and exams.  Things you can do on your own to prevent diseases and keep yourself healthy. What should I know about diet, weight, and exercise? Eat a healthy diet   Eat a diet that includes plenty of vegetables, fruits, low-fat dairy products, and lean protein.  Do not eat a lot of foods that are high in solid fats, added sugars, or sodium. Maintain a healthy weight Body mass index (BMI) is used to identify weight problems. It estimates body fat based on height and weight. Your health care provider can help determine your BMI and help you achieve or maintain a healthy weight. Get regular exercise Get regular exercise. This is one of the most important things you can do for your health. Most adults should:  Exercise for at least 150 minutes each week. The exercise should increase your heart rate and make you sweat (moderate-intensity exercise).  Do strengthening exercises at least twice a week. This is in addition to the moderate-intensity exercise.  Spend less time sitting. Even light physical activity can be beneficial. Watch cholesterol and blood lipids Have your blood tested for lipids and cholesterol at 53 years of age, then have this test every 5 years. Have your cholesterol levels checked more often if:  Your lipid or cholesterol levels are high.  You are older than 53 years of age.  You are at high risk for heart disease. What should I know about cancer screening? Depending on your health history and family history, you may need to have cancer screening at various ages. This may include screening for:  Breast cancer.  Cervical cancer.  Colorectal cancer.  Skin cancer.  Lung cancer. What should I know about heart disease, diabetes, and high blood  pressure? Blood pressure and heart disease  High blood pressure causes heart disease and increases the risk of stroke. This is more likely to develop in people who have high blood pressure readings, are of African descent, or are overweight.  Have your blood pressure checked: ? Every 3-5 years if you are 53-62 years of age. ? Every year if you are 93 years old or older. Diabetes Have regular diabetes screenings. This checks your fasting blood sugar level. Have the screening done:  Once every three years after age 69 if you are at a normal weight and have a low risk for diabetes.  More often and at a younger age if you are overweight or have a high risk for diabetes. What should I know about preventing infection? Hepatitis B If you have a higher risk for hepatitis B, you should be screened for this virus. Talk with your health care provider to find out if you are at risk for hepatitis B infection. Hepatitis C Testing is recommended for:  Everyone born from [birth year] through 1965.  Anyone with known risk factors for hepatitis C. Sexually transmitted infections (STIs)  Get screened for STIs, including gonorrhea and chlamydia, if: ? You are sexually active and are younger than 53 years of age. ? You are older than 53 years of age and your health care provider tells you that you are at risk for this type of infection. ? Your sexual activity has changed since you were last screened, and you are at increased risk for chlamydia or gonorrhea. Ask your health care provider if  you are at risk. °· Ask your health care provider about whether you are at high risk for HIV. Your health care provider may recommend a prescription medicine to help prevent HIV infection. If you choose to take medicine to prevent HIV, you should first get tested for HIV. You should then be tested every 3 months for as long as you are taking the medicine. °Pregnancy °· If you are about to stop having your period (premenopausal) and  you may become pregnant, seek counseling before you get pregnant. °· Take 400 to 800 micrograms (mcg) of folic acid every day if you become pregnant. °· Ask for birth control (contraception) if you want to prevent pregnancy. °Osteoporosis and menopause °Osteoporosis is a disease in which the bones lose minerals and strength with aging. This can result in bone fractures. If you are 65 years old or older, or if you are at risk for osteoporosis and fractures, ask your health care provider if you should: °· Be screened for bone loss. °· Take a calcium or vitamin D supplement to lower your risk of fractures. °· Be given hormone replacement therapy (HRT) to treat symptoms of menopause. °Follow these instructions at home: °Lifestyle °· Do not use any products that contain nicotine or tobacco, such as cigarettes, e-cigarettes, and chewing tobacco. If you need help quitting, ask your health care provider. °· Do not use street drugs. °· Do not share needles. °· Ask your health care provider for help if you need support or information about quitting drugs. °Alcohol use °· Do not drink alcohol if: °? Your health care provider tells you not to drink. °? You are pregnant, may be pregnant, or are planning to become pregnant. °· If you drink alcohol: °? Limit how much you use to 0-1 drink a day. °? Limit intake if you are breastfeeding. °· Be aware of how much alcohol is in your drink. In the [place] one drink equals one 12 oz bottle of beer (355 mL), one 5 oz glass of wine (148 mL), or one 1½ oz glass of hard liquor (44 mL). °General instructions °· Schedule regular health, dental, and eye exams. °· Stay current with your vaccines. °· Tell your health care provider if: °? You often feel depressed. °? You have ever been abused or do not feel safe at home. °Summary °· Adopting a healthy lifestyle and getting preventive care are important in promoting health and wellness. °· Follow your health care provider's instructions about healthy  diet, exercising, and getting tested or screened for diseases. °· Follow your health care provider's instructions on monitoring your cholesterol and blood pressure. °This information is not intended to replace advice given to you by your health care provider. Make sure you discuss any questions you have with your health care provider. °Document Revised: 11/16/2018 Document Reviewed: 11/16/2018 °Elsevier Patient Education © 2020 [employer] ° °Preventive Care 40-64 Years Old, Female °Preventive care refers to visits with your health care provider and lifestyle choices that can promote health and wellness. This includes: °· A yearly physical exam. This may also be called an annual well check. °· Regular dental visits and eye exams. °· Immunizations. °· Screening for certain conditions. °· Healthy lifestyle choices, such as eating a healthy diet, getting regular exercise, not using drugs or products that contain nicotine and tobacco, and limiting alcohol use. °What can I expect for my preventive care visit? °Physical exam °Your health care provider will check your: °· Height and weight. This may be used   to calculate body mass index (BMI), which tells if you are at a healthy weight. °· Heart rate and blood pressure. °· Skin for abnormal spots. °Counseling °Your health care provider may ask you questions about your: °· Alcohol, tobacco, and drug use. °· Emotional well-being. °· Home and relationship well-being. °· Sexual activity. °· Eating habits. °· Work and work environment. °· Method of birth control. °· Menstrual cycle. °· Pregnancy history. °What immunizations do I need? ° °Influenza (flu) vaccine °· This is recommended every year. °Tetanus, diphtheria, and pertussis (Tdap) vaccine °· You may need a Td booster every 10 years. °Varicella (chickenpox) vaccine °· You may need this if you have not been vaccinated. °Zoster (shingles) vaccine °· You may need this after age 60. °Measles, mumps, and rubella (MMR)  vaccine °· You may need at least one dose of MMR if you were born in 1957 or later. You may also need a second dose. °Pneumococcal conjugate (PCV13) vaccine °· You may need this if you have certain conditions and were not previously vaccinated. °Pneumococcal polysaccharide (PPSV23) vaccine °· You may need one or two doses if you smoke cigarettes or if you have certain conditions. °Meningococcal conjugate (MenACWY) vaccine °· You may need this if you have certain conditions. °Hepatitis A vaccine °· You may need this if you have certain conditions or if you travel or work in places where you may be exposed to hepatitis A. °Hepatitis B vaccine °· You may need this if you have certain conditions or if you travel or work in places where you may be exposed to hepatitis B. °Haemophilus influenzae type b (Hib) vaccine °· You may need this if you have certain conditions. °Human papillomavirus (HPV) vaccine °· If recommended by your health care provider, you may need three doses over 6 months. °You may receive vaccines as individual doses or as more than one vaccine together in one shot (combination vaccines). Talk with your health care provider about the risks and benefits of combination vaccines. °What tests do I need? °Blood tests °· Lipid and cholesterol levels. These may be checked every 5 years, or more frequently if you are over 50 years old. °· Hepatitis C test. °· Hepatitis B test. °Screening °· Lung cancer screening. You may have this screening every year starting at age 55 if you have a 30-pack-year history of smoking and currently smoke or have quit within the past 15 years. °· Colorectal cancer screening. All adults should have this screening starting at age 50 and continuing until age 75. Your health care provider may recommend screening at age 45 if you are at increased risk. You will have tests every 1-10 years, depending on your results and the type of screening test. °· Diabetes screening. This is done by  checking your blood sugar (glucose) after you have not eaten for a while (fasting). You may have this done every 1-3 years. °· Mammogram. This may be done every 1-2 years. Talk with your health care provider about when you should start having regular mammograms. This may depend on whether you have a family history of breast cancer. °· BRCA-related cancer screening. This may be done if you have a family history of breast, ovarian, tubal, or peritoneal cancers. °· Pelvic exam and Pap test. This may be done every 3 years starting at age 21. Starting at age 30, this may be done every 5 years if you have a Pap test in combination with an HPV test. °Other tests °· Sexually transmitted disease (  STD) testing. °· Bone density scan. This is done to screen for osteoporosis. You may have this scan if you are at high risk for osteoporosis. °Follow these instructions at home: °Eating and drinking °· Eat a diet that includes fresh fruits and vegetables, whole grains, lean protein, and low-fat dairy. °· Take vitamin and mineral supplements as recommended by your health care provider. °· Do not drink alcohol if: °? Your health care provider tells you not to drink. °? You are pregnant, may be pregnant, or are planning to become pregnant. °· If you drink alcohol: °? Limit how much you have to 0-1 drink a day. °? Be aware of how much alcohol is in your drink. In the [place] one drink equals one 12 oz bottle of beer (355 mL), one 5 oz glass of wine (148 mL), or one 1½ oz glass of hard liquor (44 mL). °Lifestyle °· Take daily care of your teeth and gums. °· Stay active. Exercise for at least 30 minutes on 5 or more days each week. °· Do not use any products that contain nicotine or tobacco, such as cigarettes, e-cigarettes, and chewing tobacco. If you need help quitting, ask your health care provider. °· If you are sexually active, practice safe sex. Use a condom or other form of birth control (contraception) in order to prevent pregnancy  and STIs (sexually transmitted infections). °· If told by your health care provider, take low-dose aspirin daily starting at age 50. °What's next? °· Visit your health care provider once a year for a well check visit. °· Ask your health care provider how often you should have your eyes and teeth checked. °· Stay up to date on all vaccines. °This information is not intended to replace advice given to you by your health care provider. Make sure you discuss any questions you have with your health care provider. °Document Revised: 08/04/2018 Document Reviewed: 08/04/2018 °Elsevier Patient Education © 2020 Elsevier Inc. ° °

## 2020-09-05 NOTE — Progress Notes (Addendum)
Established Patient Office Visit  Subjective:  Patient ID: Colleen Haley, female    DOB: 10/03/1966  Age: 55 y.o. MRN: [id number]  CC:  Chief Complaint  Patient presents with  . Transitions Of Care    toc from Dr. Deborra Medina, no concerns pt fasting.     HPI Colleen Haley presents for a health check.  She is doing well as far as she knows.  Chronic headaches have been well controlled with as needed Topamax.  She works in an urgent care.  She is about to celebrate 32 years of marriage.  Status post normal colonoscopy.  Sees the eye doctor and dentist yearly.  Status post hysterectomy for uterine fibroids.  She does admit angst and stress about her concern about family members and the pandemic.  Fortunately they have all been vaccinated.  She is not interested in medicines for this at this time.  Past Medical History:  Diagnosis Date  . Constipation   . Migraines   . Positive TB test   . Stress incontinence, female    Followed by Urology    Past Surgical History:  Procedure Laterality Date  . BILATERAL SALPINGECTOMY  10/19/2012   Procedure: BILATERAL SALPINGECTOMY;  Surgeon: Emily Filbert, MD;  Location: [facility];  Service: Gynecology;  Laterality: N/A;  . BLADDER SUSPENSION  10/19/2012   Procedure: TRANSVAGINAL TAPE (TVT) PROCEDURE;  Surgeon: Emily Filbert, MD;  Location: [facility];  Service: Gynecology;  Laterality: N/A;  . COLONOSCOPY WITH PROPOFOL N/A 12/31/2017   Procedure: COLONOSCOPY WITH PROPOFOL;  Surgeon: Lucilla Lame, MD;  Location: [facility];  Service: Endoscopy;  Laterality: N/A;  . CYSTOSCOPY  10/19/2012   Procedure: CYSTOSCOPY;  Surgeon: Emily Filbert, MD;  Location: [facility];  Service: Gynecology;  Laterality: N/A;  . ROBOTIC ASSISTED TOTAL HYSTERECTOMY  10/19/2012   Procedure: ROBOTIC ASSISTED TOTAL HYSTERECTOMY;  Surgeon: Emily Filbert, MD;  Location: [facility];  Service: Gynecology;  Laterality: N/A;  . TUBAL LIGATION  1999   general anesthesia  . VAGINAL DELIVERY   1989.1990    Family History  Problem Relation Age of Onset  . Hyperlipidemia Mother   . Hypertension Mother   . Hyperlipidemia Father   . Hypertension Father   . Glaucoma Father   . Diabetes Father   . Hyperlipidemia Sister   . Breast cancer Neg Hx     Social History   Socioeconomic History  . Marital status: Married    Spouse name: Not on file  . Number of children: Not on file  . Years of education: Not on file  . Highest education level: Not on file  Occupational History  . Not on file  Tobacco Use  . Smoking status: Never Smoker  . Smokeless tobacco: Never Used  Vaping Use  . Vaping Use: Never used  Substance and Sexual Activity  . Alcohol use: Yes    Alcohol/week: 1.0 standard drink    Types: 1 Standard drinks or equivalent per week    Comment: occasional  . Drug use: No  . Sexual activity: Not Currently    Partners: Male  Other Topics Concern  . Not on file  Social History Narrative   CMA.   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   Social Determinants of Health   Financial Resource Strain:   . Difficulty of Paying Living Expenses: Not on file  Food Insecurity:   . Worried About Charity fundraiser in the Last Year: Not on file  . Ran  Out of Food in the Last Year: Not on file  Transportation Needs:   . Lack of Transportation (Medical): Not on file  . Lack of Transportation (Non-Medical): Not on file  Physical Activity:   . Days of Exercise per Week: Not on file  . Minutes of Exercise per Session: Not on file  Stress:   . Feeling of Stress : Not on file  Social Connections:   . Frequency of Communication with Friends and Family: Not on file  . Frequency of Social Gatherings with Friends and Family: Not on file  . Attends Religious Services: Not on file  . Active Member of Clubs or Organizations: Not on file  . Attends Archivist Meetings: Not on file  . Marital Status: Not on file  Intimate Partner Violence:   . Fear of Current or Ex-Partner: Not  on file  . Emotionally Abused: Not on file  . Physically Abused: Not on file  . Sexually Abused: Not on file    Outpatient Medications Prior to Visit  Medication Sig Dispense Refill  . ibuprofen (ADVIL) 800 MG tablet TAKE 1 TABLET BY MOUTH  DAILY AS NEEDED FOR PAIN OR HEADACHE 90 tablet 3  . topiramate (TOPAMAX) 25 MG tablet TAKE 1 TABLET(25 MG) BY MOUTH TWICE DAILY 180 tablet 1  . ergocalciferol (VITAMIN D2) 1.25 MG (50000 UT) capsule Vitamin D2 1,250 mcg (50,000 unit) capsule (Patient not taking: Reported on 09/05/2020)     No facility-administered medications prior to visit.    No Known Allergies  ROS Review of Systems  Constitutional: Negative.   HENT: Negative.   Eyes: Negative for photophobia and visual disturbance.  Respiratory: Negative.  Negative for chest tightness and shortness of breath.   Cardiovascular: Negative.  Negative for chest pain and palpitations.  Gastrointestinal: Negative.   Endocrine: Negative for polyphagia and polyuria.  Genitourinary: Negative.   Musculoskeletal: Negative for gait problem and joint swelling.  Allergic/Immunologic: Negative for immunocompromised state.  Neurological: Negative for tremors and speech difficulty.  Hematological: Does not bruise/bleed easily.  Psychiatric/Behavioral: The patient is nervous/anxious.    Depression screen [facility] 2/9 09/05/2020 09/05/2020 12/15/2017  Decreased Interest 0 0 0  Down, Depressed, Hopeless 1 0 0  PHQ - 2 Score 1 0 0  Altered sleeping 1 - -  Tired, decreased energy 1 - -  Change in appetite 0 - -  Feeling bad or failure about yourself  0 - -  Trouble concentrating 1 - -  Moving slowly or fidgety/restless 0 - -  Suicidal thoughts 0 - -  PHQ-9 Score 4 - -  Difficult doing work/chores Not difficult at all - -      Objective:    Physical Exam Vitals and nursing note reviewed.  Constitutional:      General: She is not in acute distress.    Appearance: Normal appearance. She is not  ill-appearing, toxic-appearing or diaphoretic.  HENT:     Head: Normocephalic and atraumatic.     Right Ear: Tympanic membrane, ear canal and external ear normal.     Left Ear: Tympanic membrane, ear canal and external ear normal.  Eyes:     General: No scleral icterus.       Right eye: No discharge.        Left eye: No discharge.     Conjunctiva/sclera: Conjunctivae normal.     Pupils: Pupils are equal, round, and reactive to light.  Cardiovascular:     Rate and Rhythm: Normal rate and  regular rhythm.  Pulmonary:     Effort: Pulmonary effort is normal.     Breath sounds: Normal breath sounds.  Abdominal:     General: Bowel sounds are normal.  Musculoskeletal:     Cervical back: Normal range of motion. No rigidity or tenderness.     Right lower leg: No edema.     Left lower leg: No edema.     Right ankle: Swelling present.     Left ankle: Swelling present.  Lymphadenopathy:     Cervical: No cervical adenopathy.  Skin:    General: Skin is warm and dry.  Neurological:     General: No focal deficit present.     Mental Status: She is alert and oriented to person, place, and time.  Psychiatric:        Mood and Affect: Mood normal.        Behavior: Behavior normal.     BP 122/78   Pulse 79   Temp (!) 97.2 F (36.2 C) (Tympanic)   Ht 5\' 3"  (1.6 m)   Wt 189 lb (85.7 kg)   LMP 09/28/2012   SpO2 100%   BMI 33.48 kg/m  Wt Readings from Last 3 Encounters:  09/05/20 189 lb (85.7 kg)  08/02/20 193 lb 3.2 oz (87.6 kg)  07/12/20 192 lb 9.6 oz (87.4 kg)     Health Maintenance Due  Topic Date Due  . Hepatitis C Screening  Never done  . HIV Screening  Never done  . PAP SMEAR-Modifier  09/08/2015    There are no preventive care reminders to display for this patient.  Lab Results  Component Value Date   TSH 1.420 07/20/2019   Lab Results  Component Value Date   WBC 8.2 09/05/2020   HGB 13.5 09/05/2020   HCT 41.1 09/05/2020   MCV 90 09/05/2020   PLT 213 09/05/2020     Lab Results  Component Value Date   NA 141 09/05/2020   K 4.4 09/05/2020   CO2 23 09/05/2020   GLUCOSE 109 (H) 09/05/2020   BUN 12 09/05/2020   CREATININE 0.81 09/05/2020   BILITOT 0.4 09/05/2020   ALKPHOS 117 09/05/2020   AST 10 09/05/2020   ALT 21 09/05/2020   PROT 7.2 09/05/2020   ALBUMIN 4.5 09/05/2020   CALCIUM 11.5 (H) 09/05/2020   GFR 119.74 12/15/2017   Lab Results  Component Value Date   CHOL 256 (H) 09/05/2020   Lab Results  Component Value Date   HDL 39 (L) 09/05/2020   Lab Results  Component Value Date   LDLCALC 176 (H) 09/05/2020   Lab Results  Component Value Date   TRIG 219 (H) 09/05/2020   Lab Results  Component Value Date   CHOLHDL 6.6 (H) 09/05/2020   Lab Results  Component Value Date   HGBA1C 5.6 08/29/2012      Assessment & Plan:   Problem List Items Addressed This Visit      Other   Vitamin D deficiency   Relevant Medications   ergocalciferol (VITAMIN D2) 1.25 MG (50000 UT) capsule   Other Relevant Orders   VITAMIN D 25 Hydroxy (Vit-D Deficiency, Fractures) (Completed)   Encounter for screening mammogram for malignant neoplasm of breast   Relevant Orders   MM Digital Screening   Need for influenza vaccination - Primary   Relevant Orders   Flu Vaccine QUAD 6+ mos PF IM (Fluarix Quad PF) (Completed)   Hypercalcemia   Relevant Orders   PTH, intact (no Ca)  Elevated LDL cholesterol level      Meds ordered this encounter  Medications  . ergocalciferol (VITAMIN D2) 1.25 MG (50000 UT) capsule    Sig: Take one weekly.    Dispense:  12 capsule    Refill:  1    Follow-up: Return in about 1 year (around 09/05/2021).  Given information on health maintenance and disease prevention.  Could consider counseling.  She can let me know.  Continue Topamax.  For the mild swelling in her ankles suggested compression stockings.  Libby Maw, MD

## 2020-09-06 LAB — CBC
Hematocrit: 41.1 % (ref 34.0–46.6)
Hemoglobin: 13.5 g/dL (ref 11.1–15.9)
MCH: 29.6 pg (ref 26.6–33.0)
MCHC: 32.8 g/dL (ref 31.5–35.7)
MCV: 90 fL (ref 79–97)
Platelets: 213 10*3/uL (ref 150–450)
RBC: 4.56 x10E6/uL (ref 3.77–5.28)
RDW: 12.3 % (ref 11.7–15.4)
WBC: 8.2 10*3/uL (ref 3.4–10.8)

## 2020-09-06 LAB — URINALYSIS, ROUTINE W REFLEX MICROSCOPIC
Bilirubin, UA: NEGATIVE
Glucose, UA: NEGATIVE
Ketones, UA: NEGATIVE
Leukocytes,UA: NEGATIVE
Nitrite, UA: NEGATIVE
Protein,UA: NEGATIVE
RBC, UA: NEGATIVE
Specific Gravity, UA: 1.017 (ref 1.005–1.030)
Urobilinogen, Ur: 0.2 mg/dL (ref 0.2–1.0)
pH, UA: 7.5 (ref 5.0–7.5)

## 2020-09-06 LAB — COMPREHENSIVE METABOLIC PANEL
ALT: 21 IU/L (ref 0–32)
AST: 10 IU/L (ref 0–40)
Albumin/Globulin Ratio: 1.7 (ref 1.2–2.2)
Albumin: 4.5 g/dL (ref 3.8–4.9)
Alkaline Phosphatase: 117 IU/L (ref 44–121)
BUN/Creatinine Ratio: 15 (ref 9–23)
BUN: 12 mg/dL (ref 6–24)
Bilirubin Total: 0.4 mg/dL (ref 0.0–1.2)
CO2: 23 mmol/L (ref 20–29)
Calcium: 11.5 mg/dL — ABNORMAL HIGH (ref 8.7–10.2)
Chloride: 106 mmol/L (ref 96–106)
Creatinine, Ser: 0.81 mg/dL (ref 0.57–1.00)
GFR calc Af Amer: 97 mL/min/{1.73_m2} (ref >59)
GFR calc non Af Amer: 84 mL/min/{1.73_m2} (ref >59)
Globulin, Total: 2.7 g/dL (ref 1.5–4.5)
Glucose: 109 mg/dL — ABNORMAL HIGH (ref 65–99)
Potassium: 4.4 mmol/L (ref 3.5–5.2)
Sodium: 141 mmol/L (ref 134–144)
Total Protein: 7.2 g/dL (ref 6.0–8.5)

## 2020-09-06 LAB — LIPID PANEL
Chol/HDL Ratio: 6.6 ratio — ABNORMAL HIGH (ref 0.0–4.4)
Cholesterol, Total: 256 mg/dL — ABNORMAL HIGH (ref 100–199)
HDL: 39 mg/dL — ABNORMAL LOW (ref >39)
LDL Chol Calc (NIH): 176 mg/dL — ABNORMAL HIGH (ref 0–99)
Triglycerides: 219 mg/dL — ABNORMAL HIGH (ref 0–149)
VLDL Cholesterol Cal: 41 mg/dL — ABNORMAL HIGH (ref 5–40)

## 2020-09-06 LAB — VITAMIN D 25 HYDROXY (VIT D DEFICIENCY, FRACTURES): Vit D, 25-Hydroxy: 19.3 ng/mL — ABNORMAL LOW (ref 30.0–100.0)

## 2020-09-07 DIAGNOSIS — E7800 Pure hypercholesterolemia, unspecified: Secondary | ICD-10-CM | POA: Insufficient documentation

## 2020-09-07 MED ORDER — ERGOCALCIFEROL 1.25 MG (50000 UT) PO CAPS: ORAL_CAPSULE | ORAL | 1 refills | Status: DC

## 2020-09-07 NOTE — Addendum Note (Signed)
Addended by: Jon Billings on: 09/07/2020 03:34 PM   Modules accepted: Orders

## 2020-09-08 MED ORDER — ERGOCALCIFEROL 1.25 MG (50000 UT) PO CAPS: ORAL_CAPSULE | ORAL | 1 refills | Status: DC

## 2020-09-08 NOTE — Addendum Note (Signed)
Addended by: Jon Billings on: 09/08/2020 04:01 PM   Modules accepted: Orders

## 2020-09-11 ENCOUNTER — Other Ambulatory Visit: Payer: Self-pay | Admitting: Family Medicine

## 2020-09-11 DIAGNOSIS — Z1231 Encounter for screening mammogram for malignant neoplasm of breast: Secondary | ICD-10-CM

## 2020-10-11 ENCOUNTER — Other Ambulatory Visit: Payer: Self-pay

## 2020-10-11 MED ORDER — IBUPROFEN 800 MG PO TABS: ORAL_TABLET | ORAL | 3 refills | Status: DC

## 2020-10-11 NOTE — Telephone Encounter (Signed)
Received a refill request for:  Ibuprofen 800 mg tabs LOV 09/05/20 LR 12/13/19, #90, 3 rf's. FOV none scheduled.  Not due till 08/2021.  RX sent to the mail order. Dm/cma

## 2020-10-24 ENCOUNTER — Other Ambulatory Visit: Payer: Self-pay

## 2020-10-24 ENCOUNTER — Ambulatory Visit
Admission: RE | Admit: 2020-10-24 | Discharge: 2020-10-24 | Disposition: A | Discharge status: Home / Self Care | Payer: Managed Care, Other (non HMO) | Source: Ambulatory Visit | Attending: Family Medicine | Admitting: Family Medicine

## 2020-10-24 DIAGNOSIS — Z1231 Encounter for screening mammogram for malignant neoplasm of breast: Secondary | ICD-10-CM | POA: Insufficient documentation

## 2020-11-09 ENCOUNTER — Other Ambulatory Visit: Payer: Self-pay | Admitting: Family Medicine

## 2020-11-09 DIAGNOSIS — E559 Vitamin D deficiency, unspecified: Secondary | ICD-10-CM

## 2021-09-11 ENCOUNTER — Other Ambulatory Visit: Payer: Self-pay | Admitting: Family Medicine

## 2021-09-11 DIAGNOSIS — Z1231 Encounter for screening mammogram for malignant neoplasm of breast: Secondary | ICD-10-CM

## 2021-09-13 ENCOUNTER — Other Ambulatory Visit: Payer: Self-pay | Admitting: Family Medicine

## 2021-09-30 ENCOUNTER — Other Ambulatory Visit: Payer: Self-pay

## 2021-09-30 ENCOUNTER — Ambulatory Visit
Admission: EM | Admit: 2021-09-30 | Discharge: 2021-09-30 | Disposition: A | Discharge status: Home / Self Care | Payer: Managed Care, Other (non HMO) | Attending: Physician Assistant | Admitting: Physician Assistant

## 2021-09-30 DIAGNOSIS — Z008 Encounter for other general examination: Secondary | ICD-10-CM | POA: Diagnosis not present

## 2021-09-30 DIAGNOSIS — Z23 Encounter for immunization: Secondary | ICD-10-CM

## 2021-09-30 MED ORDER — TETANUS-DIPHTH-ACELL PERTUSSIS 5-2.5-18.5 LF-MCG/0.5 IM SUSY
0.5000 mL | PREFILLED_SYRINGE | Freq: Once | INTRAMUSCULAR | Status: AC
  Administered 2021-09-30: 0.5 mL via INTRAMUSCULAR

## 2021-11-03 ENCOUNTER — Encounter: Payer: Managed Care, Other (non HMO) | Admitting: Family Medicine

## 2021-11-12 ENCOUNTER — Ambulatory Visit
Admission: RE | Admit: 2021-11-12 | Discharge: 2021-11-12 | Disposition: A | Discharge status: Home / Self Care | Payer: Managed Care, Other (non HMO) | Source: Ambulatory Visit | Attending: Family Medicine | Admitting: Family Medicine

## 2021-11-12 ENCOUNTER — Other Ambulatory Visit: Payer: Self-pay

## 2021-11-12 DIAGNOSIS — Z1231 Encounter for screening mammogram for malignant neoplasm of breast: Secondary | ICD-10-CM | POA: Insufficient documentation

## 2021-11-18 ENCOUNTER — Ambulatory Visit (INDEPENDENT_AMBULATORY_CARE_PROVIDER_SITE_OTHER): Payer: Managed Care, Other (non HMO) | Admitting: Family Medicine

## 2021-11-18 ENCOUNTER — Encounter: Payer: Self-pay | Admitting: Family Medicine

## 2021-11-18 ENCOUNTER — Other Ambulatory Visit: Payer: Self-pay

## 2021-11-18 VITALS — BP 156/90 | HR 91 | Temp 97.2°F | Ht 63.0 in | Wt 185.2 lb

## 2021-11-18 DIAGNOSIS — E559 Vitamin D deficiency, unspecified: Secondary | ICD-10-CM | POA: Diagnosis not present

## 2021-11-18 DIAGNOSIS — E7800 Pure hypercholesterolemia, unspecified: Secondary | ICD-10-CM

## 2021-11-18 DIAGNOSIS — R030 Elevated blood-pressure reading, without diagnosis of hypertension: Secondary | ICD-10-CM

## 2021-11-18 NOTE — Progress Notes (Addendum)
Established Patient Office Visit  Subjective:  Patient ID: Colleen Haley, female    DOB: 09/09/1966  Age: 55 y.o. MRN: [id number]  CC:  Chief Complaint  Patient presents with   Annual Exam    CPE, no concerns. Patient not fasting.     HPI Colleen Haley presents for her yearly physical.  Doing okay.  Family is okay.  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  She took the high-dose vitamin D tablets and discontinued them when she ran out.  Blood pressure at home has been running in the 120s over 80s she tells me.  Both parents have hypertension.  She denies headaches blurred vision or lightheadedness.  She has adjusted her diet to low-fat low-cholesterol.  Status post TAH for uterine fibroids.  She says that she was advised that Pap smears were no longer needed.  She is married and monogamous relationship with her husband.  Past Medical History:  Diagnosis Date   Constipation    Migraines    Positive TB test    Stress incontinence, female    Followed by Urology    Past Surgical History:  Procedure Laterality Date   BILATERAL SALPINGECTOMY  10/19/2012   Procedure: BILATERAL SALPINGECTOMY;  Surgeon: Emily Filbert, MD;  Location: [facility];  Service: Gynecology;  Laterality: N/A;   BLADDER SUSPENSION  10/19/2012   Procedure: TRANSVAGINAL TAPE (TVT) PROCEDURE;  Surgeon: Emily Filbert, MD;  Location: [facility];  Service: Gynecology;  Laterality: N/A;   COLONOSCOPY WITH PROPOFOL N/A 12/31/2017   Procedure: COLONOSCOPY WITH PROPOFOL;  Surgeon: Lucilla Lame, MD;  Location: [facility];  Service: Endoscopy;  Laterality: N/A;   CYSTOSCOPY  10/19/2012   Procedure: CYSTOSCOPY;  Surgeon: Emily Filbert, MD;  Location: [facility];  Service: Gynecology;  Laterality: N/A;   ROBOTIC ASSISTED TOTAL HYSTERECTOMY  10/19/2012   Procedure: ROBOTIC ASSISTED TOTAL HYSTERECTOMY;  Surgeon: Emily Filbert, MD;  Location: [facility];  Service: Gynecology;  Laterality: N/A;   TUBAL  LIGATION  1999   general anesthesia   VAGINAL DELIVERY  1989.1990    Family History  Problem Relation Age of Onset   Hyperlipidemia Mother    Hypertension Mother    Hyperlipidemia Father    Hypertension Father    Glaucoma Father    Diabetes Father    Hyperlipidemia Sister    Breast cancer Cousin 38    Social History   Socioeconomic History   Marital status: Married    Spouse name: Not on file   Number of children: Not on file   Years of education: Not on file   Highest education level: Not on file  Occupational History   Not on file  Tobacco Use   Smoking status: Never   Smokeless tobacco: Never  Vaping Use   Vaping Use: Never used  Substance and Sexual Activity   Alcohol use: Yes    Alcohol/week: 1.0 standard drink    Types: 1 Standard drinks or equivalent per week    Comment: occasional   Drug use: No   Sexual activity: Not Currently    Partners: Male  Other Topics Concern   Not on file  Social History Narrative   CMA.   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   Social Determinants of Health   Financial Resource Strain: Not on file  Food Insecurity: Not on file  Transportation Needs: Not on file  Physical Activity: Not on file  Stress: Not  on file  Social Connections: Not on file  Intimate Partner Violence: Not on file    Outpatient Medications Prior to Visit  Medication Sig Dispense Refill   ibuprofen (ADVIL) 800 MG tablet TAKE 1 TABLET BY MOUTH  DAILY AS NEEDED FOR PAIN OR HEADACHE 90 tablet 0   topiramate (TOPAMAX) 25 MG tablet TAKE 1 TABLET(25 MG) BY MOUTH TWICE DAILY 180 tablet 1   Vitamin D, Ergocalciferol, (DRISDOL) 1.25 MG (50000 UNIT) CAPS capsule TAKE 1 CAPSULE BY MOUTH  WEEKLY 13 capsule 3   No facility-administered medications prior to visit.    No Known Allergies  ROS Review of Systems  Constitutional:  Negative for chills, diaphoresis, fatigue, fever and unexpected weight change.  HENT: Negative.    Eyes:  Negative for photophobia and visual  disturbance.  Respiratory: Negative.    Cardiovascular: Negative.   Gastrointestinal: Negative.   Endocrine: Negative for polyphagia and polyuria.  Genitourinary: Negative.   Musculoskeletal:  Negative for arthralgias and gait problem.  Neurological:  Negative for speech difficulty, weakness, light-headedness and headaches.  Hematological:  Does not bruise/bleed easily.  Psychiatric/Behavioral: Negative.       Objective:    Physical Exam Vitals and nursing note reviewed.  Constitutional:      General: She is not in acute distress.    Appearance: Normal appearance. She is not ill-appearing, toxic-appearing or diaphoretic.  HENT:     Head: Normocephalic and atraumatic.     Right Ear: Tympanic membrane, ear canal and external ear normal.     Left Ear: Tympanic membrane, ear canal and external ear normal.     Mouth/Throat:     Mouth: Mucous membranes are moist.     Pharynx: Oropharynx is clear. No oropharyngeal exudate or posterior oropharyngeal erythema.  Eyes:     General: No scleral icterus.       Right eye: No discharge.        Left eye: No discharge.     Extraocular Movements: Extraocular movements intact.     Conjunctiva/sclera: Conjunctivae normal.     Pupils: Pupils are equal, round, and reactive to light.  Neck:     Vascular: No carotid bruit.  Cardiovascular:     Rate and Rhythm: Normal rate and regular rhythm.  Pulmonary:     Effort: Pulmonary effort is normal.     Breath sounds: Normal breath sounds.  Abdominal:     General: Abdomen is flat. Bowel sounds are normal. There is no distension.     Palpations: Abdomen is soft. There is no mass.     Tenderness: There is no abdominal tenderness. There is no guarding or rebound.     Hernia: No hernia is present.  Musculoskeletal:     Cervical back: No rigidity or tenderness.     Right lower leg: No edema.     Left lower leg: No edema.  Lymphadenopathy:     Cervical: No cervical adenopathy.  Skin:    General: Skin  is warm and dry.  Neurological:     Mental Status: She is alert and oriented to person, place, and time.  Psychiatric:        Mood and Affect: Mood normal.        Behavior: Behavior normal.    BP (!) 156/90 (BP Location: Right Arm, Patient Position: Sitting, Cuff Size: Large)    Pulse 91    Temp (!) 97.2 F (36.2 C) (Temporal)    Ht '5\' 3"'  (1.6 m)    Wt 185  lb 3.2 oz (84 kg)    LMP 09/28/2012    SpO2 99%    BMI 32.81 kg/m  Wt Readings from Last 3 Encounters:  11/18/21 185 lb 3.2 oz (84 kg)  09/05/20 189 lb (85.7 kg)  08/02/20 193 lb 3.2 oz (87.6 kg)   The 10-year ASCVD risk score (Arnett DK, et al., 2019) is: 8.5%   Values used to calculate the score:     Age: 17 years     Sex: Female     Is Non-Hispanic African American: Yes     Diabetic: No     Tobacco smoker: No     Systolic Blood Pressure: 828 mmHg     Is BP treated: No     HDL Cholesterol: 44 mg/dL     Total Cholesterol: 236 mg/dL   Health Maintenance Due  Topic Date Due   HIV Screening  Never done   Hepatitis C Screening  Never done   Zoster Vaccines- Shingrix (1 of 2) Never done    There are no preventive care reminders to display for this patient.  Lab Results  Component Value Date   TSH 1.420 07/20/2019   Lab Results  Component Value Date   WBC 8.6 11/21/2021   HGB 13.3 11/21/2021   HCT 40.6 11/21/2021   MCV 90 11/21/2021   PLT 198 11/21/2021   Lab Results  Component Value Date   NA 145 (H) 11/21/2021   K 4.5 11/21/2021   CO2 24 11/21/2021   GLUCOSE 101 (H) 11/21/2021   BUN 12 11/21/2021   CREATININE 0.80 11/21/2021   BILITOT 0.3 11/21/2021   ALKPHOS 98 11/21/2021   AST 8 11/21/2021   ALT 21 11/21/2021   PROT 6.9 11/21/2021   ALBUMIN 4.5 11/21/2021   CALCIUM 10.7 (H) 11/21/2021   EGFR 88 11/21/2021   GFR 119.74 12/15/2017   Lab Results  Component Value Date   CHOL 236 (H) 11/21/2021   Lab Results  Component Value Date   HDL 44 11/21/2021   Lab Results  Component Value Date   LDLCALC  159 (H) 11/21/2021   Lab Results  Component Value Date   TRIG 179 (H) 11/21/2021   Lab Results  Component Value Date   CHOLHDL 5.4 (H) 11/21/2021   Lab Results  Component Value Date   HGBA1C 5.6 08/29/2012      Assessment & Plan:   Problem List Items Addressed This Visit       Other   Vitamin D deficiency - Primary   Relevant Medications   Vitamin D, Ergocalciferol, (DRISDOL) 1.25 MG (50000 UNIT) CAPS capsule   Other Relevant Orders   VITAMIN D 25 Hydroxy (Vit-D Deficiency, Fractures)   Hypercalcemia   Relevant Orders   PTH, intact (no Ca)   Elevated LDL cholesterol level   Relevant Orders   Comprehensive metabolic panel   LDL cholesterol, direct   Lipid panel   Elevated BP without diagnosis of hypertension   Relevant Orders   CBC   Comprehensive metabolic panel   Urinalysis, Routine w reflex microscopic    Meds ordered this encounter  Medications   Vitamin D, Ergocalciferol, (DRISDOL) 1.25 MG (50000 UNIT) CAPS capsule    Sig: Take 1 capsule (50,000 Units total) by mouth every 7 (seven) days.    Dispense:  12 capsule    Refill:  1     Follow-up: Return in about 6 months (around 05/19/2022), or Return fasting for blood work..  Patient was given information on  preventing hypertension as well as the DASH diet.  She will check and record her blood pressures.  She will follow-up in 3 to 6 months with her last and her blood pressure cuff.  Information was given on preventing vitamin D deficiency.  Information was given on preventing high cholesterol.  If her LDL remains elevated I did advise that treatment would probably be indicated.  Finally she was also given information on health maintenance and disease prevention.  Libby Maw, MD

## 2021-11-21 ENCOUNTER — Telehealth: Payer: Self-pay

## 2021-11-21 ENCOUNTER — Telehealth: Payer: Self-pay | Admitting: Family Medicine

## 2021-11-21 ENCOUNTER — Other Ambulatory Visit: Payer: Self-pay | Admitting: Family Medicine

## 2021-11-21 NOTE — Telephone Encounter (Signed)
Received a call form patient requesting her lab request being faxed to [facility] in [place] to Fax # [phone].   All requisition  faxed at 8:36 am. . Dm/cma

## 2021-11-21 NOTE — Telephone Encounter (Signed)
Pt called and said that Dr Ethelene Hal was supposed to have labs sent to [facility] in [place], I asked Langley Gauss about it and she said that she is going to fax. Pt aware

## 2021-11-22 LAB — COMPREHENSIVE METABOLIC PANEL
ALT: 21 IU/L (ref 0–32)
AST: 8 IU/L (ref 0–40)
Albumin/Globulin Ratio: 1.9 (ref 1.2–2.2)
Albumin: 4.5 g/dL (ref 3.8–4.9)
Alkaline Phosphatase: 98 IU/L (ref 44–121)
BUN/Creatinine Ratio: 15 (ref 9–23)
BUN: 12 mg/dL (ref 6–24)
Bilirubin Total: 0.3 mg/dL (ref 0.0–1.2)
CO2: 24 mmol/L (ref 20–29)
Calcium: 10.7 mg/dL — ABNORMAL HIGH (ref 8.7–10.2)
Chloride: 109 mmol/L — ABNORMAL HIGH (ref 96–106)
Creatinine, Ser: 0.8 mg/dL (ref 0.57–1.00)
Globulin, Total: 2.4 g/dL (ref 1.5–4.5)
Glucose: 101 mg/dL — ABNORMAL HIGH (ref 70–99)
Potassium: 4.5 mmol/L (ref 3.5–5.2)
Sodium: 145 mmol/L — ABNORMAL HIGH (ref 134–144)
Total Protein: 6.9 g/dL (ref 6.0–8.5)
eGFR: 88 mL/min/{1.73_m2} (ref >59)

## 2021-11-22 LAB — CBC WITH DIFFERENTIAL/PLATELET
Basophils Absolute: 0.1 10*3/uL (ref 0.0–0.2)
Basos: 1 %
EOS (ABSOLUTE): 0.2 10*3/uL (ref 0.0–0.4)
Eos: 2 %
Hematocrit: 40.6 % (ref 34.0–46.6)
Hemoglobin: 13.3 g/dL (ref 11.1–15.9)
Immature Grans (Abs): 0 10*3/uL (ref 0.0–0.1)
Immature Granulocytes: 0 %
Lymphocytes Absolute: 1.8 10*3/uL (ref 0.7–3.1)
Lymphs: 21 %
MCH: 29.5 pg (ref 26.6–33.0)
MCHC: 32.8 g/dL (ref 31.5–35.7)
MCV: 90 fL (ref 79–97)
Monocytes Absolute: 0.5 10*3/uL (ref 0.1–0.9)
Monocytes: 6 %
Neutrophils Absolute: 6 10*3/uL (ref 1.4–7.0)
Neutrophils: 70 %
Platelets: 198 10*3/uL (ref 150–450)
RBC: 4.51 x10E6/uL (ref 3.77–5.28)
RDW: 12.7 % (ref 11.7–15.4)
WBC: 8.6 10*3/uL (ref 3.4–10.8)

## 2021-11-22 LAB — MICROSCOPIC EXAMINATION
Bacteria, UA: NONE SEEN
Casts: NONE SEEN /lpf
RBC, Urine: NONE SEEN /hpf (ref 0–2)

## 2021-11-22 LAB — LIPID PANEL
Chol/HDL Ratio: 5.4 ratio — ABNORMAL HIGH (ref 0.0–4.4)
Cholesterol, Total: 236 mg/dL — ABNORMAL HIGH (ref 100–199)
HDL: 44 mg/dL (ref >39)
LDL Chol Calc (NIH): 159 mg/dL — ABNORMAL HIGH (ref 0–99)
Triglycerides: 179 mg/dL — ABNORMAL HIGH (ref 0–149)
VLDL Cholesterol Cal: 33 mg/dL (ref 5–40)

## 2021-11-22 LAB — URINALYSIS, ROUTINE W REFLEX MICROSCOPIC
Bilirubin, UA: NEGATIVE
Glucose, UA: NEGATIVE
Ketones, UA: NEGATIVE
Nitrite, UA: NEGATIVE
RBC, UA: NEGATIVE
Specific Gravity, UA: 1.024 (ref 1.005–1.030)
Urobilinogen, Ur: 0.2 mg/dL (ref 0.2–1.0)
pH, UA: 7.5 (ref 5.0–7.5)

## 2021-11-22 LAB — LDL CHOLESTEROL, DIRECT: LDL Direct: 152 mg/dL — ABNORMAL HIGH (ref 0–99)

## 2021-11-22 LAB — PARATHYROID HORMONE, INTACT (NO CA): PTH: 70 pg/mL — ABNORMAL HIGH (ref 15–65)

## 2021-11-22 LAB — VITAMIN D 25 HYDROXY (VIT D DEFICIENCY, FRACTURES): Vit D, 25-Hydroxy: 17.8 ng/mL — ABNORMAL LOW (ref 30.0–100.0)

## 2021-11-24 MED ORDER — VITAMIN D (ERGOCALCIFEROL) 1.25 MG (50000 UNIT) PO CAPS: 50000.0000 [IU] | ORAL_CAPSULE | ORAL | 1 refills | Status: DC

## 2021-11-24 NOTE — Addendum Note (Signed)
Addended by: Abelino Derrick A on: 11/24/2021 11:21 AM   Modules accepted: Orders

## 2021-12-02 ENCOUNTER — Other Ambulatory Visit: Payer: Self-pay | Admitting: Family Medicine

## 2021-12-05 ENCOUNTER — Encounter: Payer: Managed Care, Other (non HMO) | Admitting: Family Medicine

## 2022-02-17 ENCOUNTER — Telehealth: Payer: Self-pay | Admitting: Family Medicine

## 2022-02-17 NOTE — Telephone Encounter (Signed)
After viewing patients chart looks like she was in for a CPE with no concerns. I'm not sure why she was charged looks like this is a question for billing if you have a number for patient to call.  ?

## 2022-02-17 NOTE — Telephone Encounter (Signed)
Pt called In wanting an explanation of why she got a $25 charge from her CPE visit with Dr. Ethelene Hal on 11/18/21. I tried letting her know it was probably that she spoke about something outside the realm of a CPE and got a copay charge. She would like more of an explanation. Please advise pt at [phone]. ?

## 2022-02-24 NOTE — Telephone Encounter (Signed)
Have spoken with patient and billing patient very upset states that she was only in the office for CPE she does not know why she has a bill for $25 billing states that she was charged for other concerns at visit and this was more than her annual visit. I viewed patients chart reason for visit and note from Provider looks as if patient had a physical. Was told that Office manager would need to clear this on patients chart. Please advise recommendations for this issue.  ?

## 2022-02-26 ENCOUNTER — Other Ambulatory Visit: Payer: Self-pay | Admitting: Family Medicine

## 2022-04-03 ENCOUNTER — Ambulatory Visit: Payer: Managed Care, Other (non HMO) | Admitting: Family Medicine

## 2022-05-11 ENCOUNTER — Other Ambulatory Visit: Payer: Self-pay | Admitting: Family Medicine

## 2022-05-11 DIAGNOSIS — E559 Vitamin D deficiency, unspecified: Secondary | ICD-10-CM

## 2022-05-18 ENCOUNTER — Telehealth: Payer: Self-pay | Admitting: Family Medicine

## 2022-05-18 DIAGNOSIS — E559 Vitamin D deficiency, unspecified: Secondary | ICD-10-CM

## 2022-05-18 DIAGNOSIS — E7800 Pure hypercholesterolemia, unspecified: Secondary | ICD-10-CM

## 2022-05-18 NOTE — Telephone Encounter (Signed)
Pt called and wanted to know if she have to come in for lab work so it can be sent to [facility] before her appointment

## 2022-05-18 NOTE — Telephone Encounter (Signed)
Patient calling states that she would like to have blood work prior to visit so she can discuss Vitamin D levels, other labs and medications. Per patient she would to know if Dr. Ethelene Hal would order labs so she could go have these drawn at [facility] before her visit. Please advise

## 2022-05-19 NOTE — Telephone Encounter (Signed)
Patient aware and will go have labs drawn.

## 2022-05-20 ENCOUNTER — Ambulatory Visit: Payer: Managed Care, Other (non HMO) | Admitting: Family Medicine

## 2022-05-24 ENCOUNTER — Other Ambulatory Visit: Payer: Self-pay | Admitting: Family Medicine

## 2022-05-27 ENCOUNTER — Telehealth: Payer: Self-pay | Admitting: Family Medicine

## 2022-05-27 NOTE — Telephone Encounter (Signed)
Pt is needing her lab orders faxed to [facility] fax [phone].  Orders from 05/19/22.

## 2022-05-27 NOTE — Telephone Encounter (Signed)
Patient aware orders faxed

## 2022-05-28 ENCOUNTER — Other Ambulatory Visit: Payer: Self-pay | Admitting: Family Medicine

## 2022-05-28 DIAGNOSIS — E210 Primary hyperparathyroidism: Secondary | ICD-10-CM

## 2022-05-29 ENCOUNTER — Encounter: Payer: Self-pay | Admitting: Family Medicine

## 2022-05-29 ENCOUNTER — Encounter: Payer: Managed Care, Other (non HMO) | Admitting: Family Medicine

## 2022-05-29 ENCOUNTER — Telehealth: Payer: Self-pay | Admitting: Family Medicine

## 2022-05-29 LAB — LIPID PANEL
Chol/HDL Ratio: 4.8 ratio — ABNORMAL HIGH (ref 0.0–4.4)
Cholesterol, Total: 231 mg/dL — ABNORMAL HIGH (ref 100–199)
HDL: 48 mg/dL (ref >39)
LDL Chol Calc (NIH): 152 mg/dL — ABNORMAL HIGH (ref 0–99)
Triglycerides: 170 mg/dL — ABNORMAL HIGH (ref 0–149)
VLDL Cholesterol Cal: 31 mg/dL (ref 5–40)

## 2022-05-29 LAB — VITAMIN D 25 HYDROXY (VIT D DEFICIENCY, FRACTURES): Vit D, 25-Hydroxy: 36.7 ng/mL (ref 30.0–100.0)

## 2022-05-29 LAB — PTH, INTACT AND CALCIUM
Calcium: 10.6 mg/dL — ABNORMAL HIGH (ref 8.7–10.2)
PTH: 86 pg/mL — ABNORMAL HIGH (ref 15–65)

## 2022-05-29 NOTE — Telephone Encounter (Signed)
Pt came into office for scheduled 6 month follow up. Pt brought in bill from 11/2021. She noted that she had CPE and did not ask any questions. She states the provider advised her that her BP was high and told her she should take it at home routinely. She said she did not bring it up. She said provider looked at past labs for cholesterol and vitamin D and asked her about having labs. She again states she did not ask about it.   Pt disputing OV that was billed in addition to CPE. Adding LB Coding to review.   Pt states she will go to [facility] moving forward because we did not inform her prior to billing that she would be charged for additional visit & because it is closer to her home.  Sending letter that Dr. Doreene Burke is removed from chart per her request.

## 2022-06-01 DIAGNOSIS — E210 Primary hyperparathyroidism: Secondary | ICD-10-CM | POA: Insufficient documentation

## 2022-11-04 ENCOUNTER — Other Ambulatory Visit: Payer: Self-pay | Admitting: Family Medicine

## 2022-12-17 ENCOUNTER — Encounter: Payer: Self-pay | Admitting: Family Medicine

## 2023-01-12 ENCOUNTER — Other Ambulatory Visit: Payer: Self-pay | Admitting: Family Medicine

## 2023-01-20 NOTE — Progress Notes (Signed)
This encounter was created in error - please disregard.

## 2023-02-10 ENCOUNTER — Ambulatory Visit (INDEPENDENT_AMBULATORY_CARE_PROVIDER_SITE_OTHER): Payer: Managed Care, Other (non HMO)

## 2023-02-10 ENCOUNTER — Ambulatory Visit
Admission: EM | Admit: 2023-02-10 | Discharge: 2023-02-10 | Disposition: A | Discharge status: Home / Self Care | Payer: Managed Care, Other (non HMO) | Attending: Physician Assistant | Admitting: Physician Assistant

## 2023-02-10 DIAGNOSIS — Z1152 Encounter for screening for COVID-19: Secondary | ICD-10-CM | POA: Diagnosis not present

## 2023-02-10 DIAGNOSIS — J101 Influenza due to other identified influenza virus with other respiratory manifestations: Secondary | ICD-10-CM | POA: Diagnosis not present

## 2023-02-10 DIAGNOSIS — R509 Fever, unspecified: Secondary | ICD-10-CM | POA: Diagnosis present

## 2023-02-10 DIAGNOSIS — R051 Acute cough: Secondary | ICD-10-CM

## 2023-02-10 LAB — RESP PANEL BY RT-PCR (RSV, FLU A&B, COVID)  RVPGX2
Influenza A by PCR: POSITIVE — AB
Influenza B by PCR: NEGATIVE
Resp Syncytial Virus by PCR: NEGATIVE
SARS Coronavirus 2 by RT PCR: NEGATIVE

## 2023-02-10 MED ORDER — OSELTAMIVIR PHOSPHATE 75 MG PO CAPS: 75.0000 mg | ORAL_CAPSULE | Freq: Two times a day (BID) | ORAL | 0 refills | Status: AC

## 2023-02-10 MED ORDER — ACETAMINOPHEN 500 MG PO TABS: 1000.0000 mg | ORAL_TABLET | Freq: Once | ORAL | Status: DC

## 2023-02-10 MED ORDER — ACETAMINOPHEN 325 MG PO TABS
975.0000 mg | ORAL_TABLET | Freq: Once | ORAL | Status: AC
  Administered 2023-02-10: 975 mg via ORAL

## 2023-02-10 MED ORDER — CHERATUSSIN AC 100-10 MG/5ML PO SOLN: 10.0000 mL | Freq: Three times a day (TID) | ORAL | 0 refills | Status: DC | PRN

## 2023-02-10 NOTE — ED Triage Notes (Signed)
Pt c/o headache, sore throat, chest burning when coughing x3days  Pt asks for a chest xray to be done.  Pt has a history of double pnuemonia.   Pt has taken ibuprofen and zofran for the pain. Last dose was at 4pm on 02/09/23.

## 2023-02-10 NOTE — Discharge Instructions (Addendum)
-  Positive influenza A.  I sent Tamiflu and cough medication to pharmacy.  Plan rest and fluids. - You should stay home until you have been fever free for greater than 24 hours without taking Tylenol and Motrin. - You should return or go to ER if uncontrolled fever, weakness or breathing difficulty.

## 2023-02-10 NOTE — ED Provider Notes (Signed)
[facility]    CSN: [id number] Arrival date & time: 02/10/23  1046      History   Chief Complaint Chief Complaint  Patient presents with   Cough    HPI Colleen Haley is a 57 y.o. female presenting for 2-day history of fever, fatigue, body aches, headache, sore throat, burning sensation in chest, cough.  Patient reports sick coworkers.  She does work in [employer].  She has a history of double pneumonia and says she is fearful of that possibility.  She has taken ibuprofen and Zofran yesterday for her symptoms.  She is not reporting any wheezing or breathing difficulty, vomiting or diarrhea.  No other complaints.  HPI  Past Medical History:  Diagnosis Date   Constipation    Migraines    Positive TB test    Stress incontinence, female    Followed by Urology    Patient Active Problem List   Diagnosis Date Noted   Primary hyperparathyroidism ([place]) 06/01/2022   Elevated BP without diagnosis of hypertension 11/18/2021   Hypercalcemia 09/07/2020   Elevated LDL cholesterol level 09/07/2020   Need for influenza vaccination 09/05/2020   Encounter for screening mammogram for malignant neoplasm of breast    Unspecified vitamin D deficiency 04/26/2014   Anxiety state 08/30/2013   Routine general medical examination at a health care facility 03/01/2013   Vitamin D deficiency 09/11/2011   Stress incontinence, female     Past Surgical History:  Procedure Laterality Date   BILATERAL SALPINGECTOMY  10/19/2012   Procedure: BILATERAL SALPINGECTOMY;  Surgeon: Emily Filbert, MD;  Location: [facility];  Service: Gynecology;  Laterality: N/A;   BLADDER SUSPENSION  10/19/2012   Procedure: TRANSVAGINAL TAPE (TVT) PROCEDURE;  Surgeon: Emily Filbert, MD;  Location: [facility];  Service: Gynecology;  Laterality: N/A;   COLONOSCOPY WITH PROPOFOL N/A 12/31/2017   Procedure: COLONOSCOPY WITH PROPOFOL;  Surgeon: Lucilla Lame, MD;  Location: [facility];  Service: Endoscopy;  Laterality: N/A;    CYSTOSCOPY  10/19/2012   Procedure: CYSTOSCOPY;  Surgeon: Emily Filbert, MD;  Location: [facility];  Service: Gynecology;  Laterality: N/A;   ROBOTIC ASSISTED TOTAL HYSTERECTOMY  10/19/2012   Procedure: ROBOTIC ASSISTED TOTAL HYSTERECTOMY;  Surgeon: Emily Filbert, MD;  Location: [facility];  Service: Gynecology;  Laterality: N/A;   TUBAL LIGATION  1999   general anesthesia   VAGINAL DELIVERY  1989.1990    OB History     Gravida  2   Para  2   Term  2   Preterm      AB      Living  2      SAB      IAB      Ectopic      Multiple      Live Births               Home Medications    Prior to Admission medications   Medication Sig Start Date End Date Taking? Authorizing Provider  guaiFENesin-codeine (CHERATUSSIN AC) 100-10 MG/5ML syrup Take 10 mLs by mouth 3 (three) times daily as needed for congestion or cough. 02/10/23  Yes Laurene Footman B, PA-C  ibuprofen (ADVIL) 800 MG tablet TAKE 1 TABLET BY MOUTH DAILY AS  NEEDED FOR PAIN OR HEADACHE 01/12/23  Yes Libby Maw, MD  oseltamivir (TAMIFLU) 75 MG capsule Take 1 capsule (75 mg total) by mouth every 12 (twelve) hours for 5 days. 02/10/23 02/15/23 Yes Danton Clap, PA-C  Vitamin D, Ergocalciferol, (DRISDOL) 1.25 MG (50000 UNIT) CAPS capsule TAKE 1 CAPSULE BY MOUTH EVERY 7 DAYS 05/11/22  Yes Libby Maw, MD    Family History Family History  Problem Relation Age of Onset   Hyperlipidemia Mother    Hypertension Mother    Hyperlipidemia Father    Hypertension Father    Glaucoma Father    Diabetes Father    Hyperlipidemia Sister    Breast cancer Cousin 71    Social History Social History   Tobacco Use   Smoking status: Never   Smokeless tobacco: Never  Vaping Use   Vaping Use: Never used  Substance Use Topics   Alcohol use: Yes    Alcohol/week: 1.0 standard drink of alcohol    Types: 1 Standard drinks or equivalent per week    Comment: occasional   Drug use: No     Allergies   Patient has no  known allergies.   Review of Systems Review of Systems  Constitutional:  Positive for fatigue and fever. Negative for chills and diaphoresis.  HENT:  Positive for congestion, rhinorrhea and sore throat. Negative for ear pain, sinus pressure and sinus pain.   Respiratory:  Positive for cough. Negative for shortness of breath and wheezing.   Cardiovascular:  Negative for chest pain.  Gastrointestinal:  Negative for abdominal pain, nausea and vomiting.  Musculoskeletal:  Positive for myalgias.  Skin:  Negative for rash.  Neurological:  Positive for headaches. Negative for weakness.  Hematological:  Negative for adenopathy.     Physical Exam Triage Vital Signs ED Triage Vitals  Enc Vitals Group     BP      Pulse      Resp      Temp      Temp src      SpO2      Weight      Height      Head Circumference      Peak Flow      Pain Score      Pain Loc      Pain Edu?      Excl. in [place]?    No data found.  Updated Vital Signs BP (!) 132/92 (BP Location: Left Arm)   Pulse (!) 111   Temp (!) 103 F (39.4 C) (Oral)   Ht '5\' 4"'$  (1.626 m)   Wt 168 lb (76.2 kg)   LMP 09/10/2012   SpO2 93%   BMI 28.84 kg/m   Physical Exam Vitals and nursing note reviewed.  Constitutional:      General: She is not in acute distress.    Appearance: Normal appearance. She is ill-appearing. She is not toxic-appearing.  HENT:     Head: Normocephalic and atraumatic.     Nose: Congestion present.     Mouth/Throat:     Mouth: Mucous membranes are moist.     Pharynx: Oropharynx is clear.  Eyes:     General: No scleral icterus.       Right eye: No discharge.        Left eye: No discharge.     Conjunctiva/sclera: Conjunctivae normal.  Cardiovascular:     Rate and Rhythm: Regular rhythm. Tachycardia present.     Heart sounds: Normal heart sounds.  Pulmonary:     Effort: Pulmonary effort is normal. No respiratory distress.     Breath sounds: Normal breath sounds.  Musculoskeletal:     Cervical  back: Neck supple.  Skin:    General:  Skin is dry.  Neurological:     General: No focal deficit present.     Mental Status: She is alert. Mental status is at baseline.     Motor: No weakness.     Gait: Gait normal.  Psychiatric:        Mood and Affect: Mood normal.        Behavior: Behavior normal.        Thought Content: Thought content normal.      UC Treatments / Results  Labs (all labs ordered are listed, but only abnormal results are displayed) Labs Reviewed  RESP PANEL BY RT-PCR (RSV, FLU A&B, COVID)  RVPGX2 - Abnormal; Notable for the following components:      Result Value   Influenza A by PCR POSITIVE (*)    All other components within normal limits    EKG   Radiology DG Chest 2 View  Result Date: 02/10/2023 CLINICAL DATA:  Cough, fever EXAM: CHEST - 2 VIEW COMPARISON:  None Available. FINDINGS: The heart size and mediastinal contours are within normal limits. Both lungs are clear. The visualized skeletal structures are unremarkable. IMPRESSION: No active cardiopulmonary disease. Electronically Signed   By: Keane Police D.O.   On: 02/10/2023 12:00    Procedures Procedures (including critical care time)  Medications Ordered in UC Medications  acetaminophen (TYLENOL) tablet 975 mg (975 mg Oral Given 02/10/23 1137)    Initial Impression / Assessment and Plan / UC Course  I have reviewed the triage vital signs and the nursing notes.  Pertinent labs & imaging results that were available during my care of the patient were reviewed by me and considered in my medical decision making (see chart for details).   57 year old female presents for fever, fatigue, body aches, cough, congestion, sore throat, burning sensation in chest x 2 days.  Reports sick coworkers.  Works in [employer].  Temp is 103 degrees.  Pulse elevated at 111 bpm.  She is ill-appearing but nontoxic.  On exam she has nasal congestion.  Chest clear to auscultation heart regular rhythm.   Acute illness  with systemic symptoms.   Patient given 975 mg Tylenol for fever in clinic.  Respiratory panel performed.  Chest x-ray ordered.  Positive influenza A.  Chest x-ray clear.  Discussed results with patient.  Will treat patient with Tamiflu which she is with in the treatment window.  Also sent Cheratussin to pharmacy.  Encouraged rest and fluids.  Reviewed recommendations for influenza, return and ER precautions.  Work note provided.   Final Clinical Impressions(s) / UC Diagnoses   Final diagnoses:  Influenza A  Acute cough  Fever, unspecified     Discharge Instructions      -Positive influenza A.  I sent Tamiflu and cough medication to pharmacy.  Plan rest and fluids. - You should stay home until you have been fever free for greater than 24 hours without taking Tylenol and Motrin. - You should return or go to ER if uncontrolled fever, weakness or breathing difficulty.     ED Prescriptions     Medication Sig Dispense Auth. Provider   oseltamivir (TAMIFLU) 75 MG capsule Take 1 capsule (75 mg total) by mouth every 12 (twelve) hours for 5 days. 10 capsule Laurene Footman B, PA-C   guaiFENesin-codeine (CHERATUSSIN AC) 100-10 MG/5ML syrup Take 10 mLs by mouth 3 (three) times daily as needed for congestion or cough. 120 mL Danton Clap, PA-C      I have reviewed the PDMP  during this encounter.   Danton Clap, PA-C 02/10/23 1236

## 2023-03-17 ENCOUNTER — Encounter: Payer: Self-pay | Admitting: Family Medicine

## 2023-03-17 ENCOUNTER — Other Ambulatory Visit: Payer: Self-pay | Admitting: Family Medicine

## 2023-03-17 ENCOUNTER — Ambulatory Visit: Payer: Managed Care, Other (non HMO) | Admitting: Family Medicine

## 2023-03-17 VITALS — BP 116/84 | HR 80 | Temp 97.2°F | Ht 64.0 in | Wt 172.0 lb

## 2023-03-17 DIAGNOSIS — Z1159 Encounter for screening for other viral diseases: Secondary | ICD-10-CM

## 2023-03-17 DIAGNOSIS — E7800 Pure hypercholesterolemia, unspecified: Secondary | ICD-10-CM | POA: Diagnosis not present

## 2023-03-17 DIAGNOSIS — Z0000 Encounter for general adult medical examination without abnormal findings: Secondary | ICD-10-CM

## 2023-03-17 DIAGNOSIS — E559 Vitamin D deficiency, unspecified: Secondary | ICD-10-CM | POA: Diagnosis not present

## 2023-03-17 DIAGNOSIS — Z1231 Encounter for screening mammogram for malignant neoplasm of breast: Secondary | ICD-10-CM

## 2023-03-17 DIAGNOSIS — R030 Elevated blood-pressure reading, without diagnosis of hypertension: Secondary | ICD-10-CM

## 2023-03-17 LAB — HM HEPATITIS C SCREENING LAB: HM Hepatitis Screen: NEGATIVE

## 2023-03-17 NOTE — Assessment & Plan Note (Addendum)
Now resolved. She has lost weight in las t year.Marland Kitchen about 20 LBs Strong family history of HTN

## 2023-03-17 NOTE — Patient Instructions (Addendum)
Please stop at the lab to have labs drawn. Please call the location of your choice from the menu below to schedule your Mammogram and/or Bone Density appointment.      [place]  [facility]   Phone:  [phone]   [address]                                                                            [place], [place] [address]                                            Services: 3D Mammogram and Bone Density  [facility] [place] ([facility])  Phone:  [phone]   [address]                        [place], [place] [address]                                              Services:  3D Mammogram and Bone Density  

## 2023-03-17 NOTE — Progress Notes (Signed)
Patient ID: Colleen Haley, female    DOB: 07/16/1966, 57 y.o.   MRN: [id number]  This visit was conducted in person.  BP 116/84 (BP Location: Left Arm, Patient Position: Sitting, Cuff Size: Normal)   Pulse 80   Temp (!) 97.2 F (36.2 C) (Temporal)   Ht 5\' 4"  (1.626 m)   Wt 172 lb (78 kg)   LMP 09/10/2012   SpO2 97%   BMI 29.52 kg/m    CC:  Chief Complaint  Patient presents with   Transitions Of Care    Subjective:   HPI: Colleen Haley is a 57 y.o. female presenting on 03/17/2023 for Transitions Of Care  Previous PCP: Dr.   Doreene Haley Prior to that Dr. Dayton Haley Last CPX: 11/2021 Due for  CPX today.    Elevated BP in past without HTN .Colleen Haley She has lost weight in las t year.Colleen Haley about 20 LBs  Strong family history of HTN  At  home 124/80 BP Readings from Last 3 Encounters:  03/17/23 116/84  02/10/23 (!) 132/92  11/18/21 (!) 156/90   Vit d def  Hypercalcemia: PTH in 05/2022 86, Ca 10.6.Colleen KitchenMarland Haley consistent with primary Hyperparathyroid.. referred to ENDO... but she did not  set this up.  Elevated Cholesterol:  Lab Results  Component Value Date   CHOL 231 (H) 05/28/2022   HDL 48 05/28/2022   LDLCALC 152 (H) 05/28/2022   LDLDIRECT 152 (H) 11/21/2021   TRIG 170 (H) 05/28/2022   CHOLHDL 4.8 (H) 05/28/2022  Using medications without problems: Muscle aches:  none Diet compliance: she has decreased butter  and chol in diet Exercise:  sporadic treadmill/ elliptical  Other complaints:  Anxiety:  In past during work and school.  Was temporarily on medication 5 years ago  No issues, in remission.      S/P TAH... S/P bladder tack for stress incontinence.  Relevant past medical, surgical, family and social history reviewed and updated as indicated. Interim medical history since our last visit reviewed. Allergies and medications reviewed and updated. Outpatient Medications Prior to Visit  Medication Sig Dispense Refill   ibuprofen (ADVIL) 800 MG tablet TAKE 1 TABLET BY MOUTH DAILY  AS  NEEDED FOR PAIN OR HEADACHE 90 tablet 0   guaiFENesin-codeine (CHERATUSSIN AC) 100-10 MG/5ML syrup Take 10 mLs by mouth 3 (three) times daily as needed for congestion or cough. 120 mL 0   Vitamin D, Ergocalciferol, (DRISDOL) 1.25 MG (50000 UNIT) CAPS capsule TAKE 1 CAPSULE BY MOUTH EVERY 7 DAYS 12 capsule 1   No facility-administered medications prior to visit.     Per HPI unless specifically indicated in ROS section below Review of Systems Objective:  BP 116/84 (BP Location: Left Arm, Patient Position: Sitting, Cuff Size: Normal)   Pulse 80   Temp (!) 97.2 F (36.2 C) (Temporal)   Ht 5\' 4"  (1.626 m)   Wt 172 lb (78 kg)   LMP 09/10/2012   SpO2 97%   BMI 29.52 kg/m   Wt Readings from Last 3 Encounters:  03/17/23 172 lb (78 kg)  02/10/23 168 lb (76.2 kg)  11/18/21 185 lb 3.2 oz (84 kg)      Physical Exam    Results for orders placed or performed during the hospital encounter of 02/10/23  Resp panel by RT-PCR (RSV, Flu A&B, Covid) Anterior Nasal Swab   Specimen: Anterior Nasal Swab  Result Value Ref Range   SARS Coronavirus 2 by RT PCR NEGATIVE NEGATIVE   Influenza A by PCR POSITIVE (A)  NEGATIVE   Influenza B by PCR NEGATIVE NEGATIVE   Resp Syncytial Virus by PCR NEGATIVE NEGATIVE    Assessment and Plan The patient's preventative maintenance and recommended screening tests for an annual wellness exam were reviewed in full today. Brought up to date unless services declined.  Counselled on the importance of diet, exercise, and its role in overall health and mortality. The patient's FH and SH was reviewed, including their home life, tobacco status, and drug and alcohol status.   Vaccines: Pap/DVE:  TAH Mammo:  DUE Bone Density: none Colon:2019 nml,  Dr. Servando Snare. Repeat in 10 years   Smoking Status: none ETOH/ drug use:  occ/none  Hep C:  due  HIV screen:   refused.  Routine general medical examination at a health care facility  Elevated BP without diagnosis of  hypertension Assessment & Plan: Now resolved. She has lost weight in las t year.Colleen Haley about 20 LBs Strong family history of HTN   Hypercalcemia Assessment & Plan:  Noted in past... PTH elevated.  Due  for re-eval.   Sister with hyperparathyroid as well.  Orders: -     PTH, intact and calcium  Hypercholesteremia Assessment & Plan:  Due for re-eval.  Orders: -     Comprehensive metabolic panel -     Lipid panel  Vitamin D deficiency Assessment & Plan:  Due for re-eval.. has not been on the medication for a while.  Orders: -     VITAMIN D 25 Hydroxy (Vit-D Deficiency, Fractures)  Screening mammogram for breast cancer -     Digital Screening Mammogram, Left and Right; Future  Need for hepatitis C screening test -     Hepatitis C antibody    Return in about 1 year (around 03/16/2024) for anuual physical with fasting labs prior.   [name], MD

## 2023-03-17 NOTE — Assessment & Plan Note (Signed)
Due for re-eval. 

## 2023-03-17 NOTE — Assessment & Plan Note (Signed)
Due for re-eval.. has not been on the medication for a while.

## 2023-03-17 NOTE — Assessment & Plan Note (Signed)
Noted in past... PTH elevated.  Due  for re-eval.   Sister with hyperparathyroid as well.

## 2023-03-19 LAB — PTH, INTACT AND CALCIUM: PTH: 66 pg/mL — ABNORMAL HIGH (ref 15–65)

## 2023-03-19 LAB — COMPREHENSIVE METABOLIC PANEL
ALT: 19 IU/L (ref 0–32)
AST: 10 IU/L (ref 0–40)
Albumin/Globulin Ratio: 1.9 (ref 1.2–2.2)
Albumin: 4.5 g/dL (ref 3.8–4.9)
Alkaline Phosphatase: 97 IU/L (ref 44–121)
BUN/Creatinine Ratio: 14 (ref 9–23)
BUN: 9 mg/dL (ref 6–24)
Bilirubin Total: 0.3 mg/dL (ref 0.0–1.2)
CO2: 21 mmol/L (ref 20–29)
Calcium: 10.6 mg/dL — ABNORMAL HIGH (ref 8.7–10.2)
Chloride: 108 mmol/L — ABNORMAL HIGH (ref 96–106)
Creatinine, Ser: 0.65 mg/dL (ref 0.57–1.00)
Globulin, Total: 2.4 g/dL (ref 1.5–4.5)
Glucose: 92 mg/dL (ref 70–99)
Potassium: 4.3 mmol/L (ref 3.5–5.2)
Sodium: 146 mmol/L — ABNORMAL HIGH (ref 134–144)
Total Protein: 6.9 g/dL (ref 6.0–8.5)
eGFR: 104 mL/min/{1.73_m2} (ref >59)

## 2023-03-19 LAB — LIPID PANEL
Chol/HDL Ratio: 4.5 ratio — ABNORMAL HIGH (ref 0.0–4.4)
Cholesterol, Total: 223 mg/dL — ABNORMAL HIGH (ref 100–199)
HDL: 50 mg/dL (ref >39)
LDL Chol Calc (NIH): 146 mg/dL — ABNORMAL HIGH (ref 0–99)
Triglycerides: 148 mg/dL (ref 0–149)
VLDL Cholesterol Cal: 27 mg/dL (ref 5–40)

## 2023-03-19 LAB — HEPATITIS C ANTIBODY: Hep C Virus Ab: NONREACTIVE

## 2023-03-19 LAB — VITAMIN D 25 HYDROXY (VIT D DEFICIENCY, FRACTURES): Vit D, 25-Hydroxy: 21.9 ng/mL — ABNORMAL LOW (ref 30.0–100.0)

## 2023-03-20 ENCOUNTER — Other Ambulatory Visit: Payer: Self-pay | Admitting: Family Medicine

## 2023-03-23 ENCOUNTER — Other Ambulatory Visit: Payer: Self-pay | Admitting: Family Medicine

## 2023-03-23 ENCOUNTER — Telehealth: Payer: Self-pay | Admitting: Family Medicine

## 2023-03-23 DIAGNOSIS — E559 Vitamin D deficiency, unspecified: Secondary | ICD-10-CM

## 2023-03-23 DIAGNOSIS — E210 Primary hyperparathyroidism: Secondary | ICD-10-CM

## 2023-03-23 MED ORDER — VITAMIN D (ERGOCALCIFEROL) 1.25 MG (50000 UNIT) PO CAPS: 50000.0000 [IU] | ORAL_CAPSULE | ORAL | 0 refills | Status: DC

## 2023-03-23 NOTE — Addendum Note (Signed)
Addended by: Damita Lack on: 03/23/2023 02:16 PM   Modules accepted: Orders

## 2023-03-23 NOTE — Telephone Encounter (Signed)
Spoke with Ms. Rubye Oaks.  She is agreeable to repeating labs in 3 months.   Would like orders placed for [facility] and she will go have them drawn at the [facility] site in July.  Please do orders only encounter for labs and do not put as future but as if we are drawing them now per Terri.

## 2023-03-23 NOTE — Addendum Note (Signed)
Addended by: Damita Lack on: 03/23/2023 02:18 PM   Modules accepted: Orders

## 2023-03-23 NOTE — Progress Notes (Signed)
Labs ordered as instructed.  Vitamin D prescription for 12-week course sent to pharmacy.

## 2023-03-23 NOTE — Telephone Encounter (Signed)
Okay to hold off on endocrinology referral.  I believe that the vitamin D will likely be of after 12 weeks of prescription vitamin D treatment as it is only borderline low.  Let her know I would suggest reevaluation with vitamin D, PTH and calcium in 3 months instead of waiting a full 6 months.  Let me know if she is agreeable

## 2023-03-23 NOTE — Progress Notes (Signed)
Colleen Haley did not want to take the high dose Vit D.  She wanted to treat herself with OTC Vit D3 and a multivitamin.  I called [facility] and cancelled prescription.

## 2023-03-23 NOTE — Telephone Encounter (Signed)
Spoke with Ms. Rubye Oaks.  She reviewed her lab results and Dr. Daphine Deutscher recommendations on MyChart.  She wants to hold off on the endocrinology referral for now.  She wants to work on the Vit D level with just an OTC Vitamin D3 and a Multivitamin but would like for Dr. Ermalene Searing to recheck her levels in 6 months after she works on improving her Vit D level.  Please advise.

## 2023-03-23 NOTE — Telephone Encounter (Signed)
Patient contacted the office requesting a call back regarding recent lab work. Please advise [phone].

## 2023-04-09 ENCOUNTER — Ambulatory Visit
Admission: RE | Admit: 2023-04-09 | Discharge: 2023-04-09 | Disposition: A | Discharge status: Home / Self Care | Payer: Managed Care, Other (non HMO) | Source: Ambulatory Visit | Attending: Family Medicine | Admitting: Family Medicine

## 2023-04-09 DIAGNOSIS — Z1231 Encounter for screening mammogram for malignant neoplasm of breast: Secondary | ICD-10-CM | POA: Insufficient documentation

## 2023-05-13 ENCOUNTER — Telehealth: Payer: Self-pay | Admitting: Family Medicine

## 2023-05-13 MED ORDER — IBUPROFEN 800 MG PO TABS: ORAL_TABLET | ORAL | 0 refills | Status: DC

## 2023-05-13 NOTE — Telephone Encounter (Signed)
Prescription Request  05/13/2023  LOV: 03/17/2023  What is the name of the medication or equipment? ibuprofen (ADVIL) 800 MG tablet   Have you contacted your pharmacy to request a refill? No   Which pharmacy would you like this sent to?   [facility] - [place], [place] - [address] [place] [address] Phone: [phone] Fax: [phone]   Patient notified that their request is being sent to the clinical staff for review and that they should receive a response within 2 business days.   Please advise at Mobile [phone] (mobile)

## 2023-06-07 ENCOUNTER — Telehealth: Payer: Self-pay | Admitting: Family Medicine

## 2023-06-07 NOTE — Telephone Encounter (Signed)
Patient contacted the office regarding lab orders placed by Dr. Ermalene Searing. Patient asked for these lab orders to be sent to the [facility] on [address] and done through [facility]. Patient states she has been here before to have labs drawn. Please advise, thank you.

## 2023-06-07 NOTE — Telephone Encounter (Signed)
Lab orders updated so patient can get labs done a [facility] as requested.  Colleen Haley notified of this via telephone.

## 2023-06-07 NOTE — Addendum Note (Signed)
Addended by: Damita Lack on: 06/07/2023 10:16 AM   Modules accepted: Orders

## 2023-06-10 LAB — PTH, INTACT AND CALCIUM
Calcium: 11 mg/dL — ABNORMAL HIGH (ref 8.7–10.2)
PTH: 81 pg/mL — ABNORMAL HIGH (ref 15–65)

## 2023-06-10 LAB — VITAMIN D 25 HYDROXY (VIT D DEFICIENCY, FRACTURES): Vit D, 25-Hydroxy: 18.3 ng/mL — ABNORMAL LOW (ref 30.0–100.0)

## 2023-06-16 ENCOUNTER — Encounter: Payer: Self-pay | Admitting: *Deleted

## 2023-06-16 ENCOUNTER — Telehealth: Payer: Self-pay | Admitting: Family Medicine

## 2023-06-16 MED ORDER — VITAMIN D (ERGOCALCIFEROL) 1.25 MG (50000 UNIT) PO CAPS: 50000.0000 [IU] | ORAL_CAPSULE | ORAL | 0 refills | Status: DC

## 2023-06-16 NOTE — Telephone Encounter (Signed)
Colleen Haley notified as instructed by telephone.  She states she is not on any thyroid medication.  That is why she is wanting to see an endocrinologist.  She states she was told by her previous doctor to not take Vit D if her thyroid levels were elevated.  I am assuming she is talking about her PTH level being elevated because all her TSH levels have been normal in the past.   She does not want to take the Vit D until she is seen by endo.  I advised that referral has been placed to [facility] and they will be reaching out to her to schedule.  Patient states understanding.  FYI to Dr. Ermalene Searing.

## 2023-06-16 NOTE — Telephone Encounter (Signed)
Patient returned call regarding lab results. She said that she is not taking the vitamin D supplement due to her being told not to take them with her thyroid medication. She said that she is fine with seeing an endocrinologist,and would like to know what recommendations that Dr Ermalene Searing has for [facility] locations?

## 2023-06-16 NOTE — Telephone Encounter (Signed)
Let patient know I have placed a referral order for [facility] endocrinology.  She does need to go ahead and start weekly vitamin D.  I have sent a prescription in.  She just needs to take this at a different time as her thyroid medication.

## 2023-07-07 ENCOUNTER — Other Ambulatory Visit: Payer: Self-pay | Admitting: Family Medicine

## 2023-07-08 NOTE — Telephone Encounter (Signed)
Last office visit 03/17/23 for CPE.  Last refilled 05/13/2023 for #90 with no refills.  Next Appt: CPE 03/21/2024.

## 2023-09-24 ENCOUNTER — Ambulatory Visit (INDEPENDENT_AMBULATORY_CARE_PROVIDER_SITE_OTHER): Payer: Managed Care, Other (non HMO)

## 2023-09-24 DIAGNOSIS — Z23 Encounter for immunization: Secondary | ICD-10-CM | POA: Diagnosis not present

## 2023-10-01 ENCOUNTER — Telehealth: Payer: Self-pay | Admitting: Family Medicine

## 2023-10-01 MED ORDER — VITAMIN D (ERGOCALCIFEROL) 1.25 MG (50000 UNIT) PO CAPS: 50000.0000 [IU] | ORAL_CAPSULE | ORAL | 0 refills | Status: DC

## 2023-10-01 NOTE — Telephone Encounter (Signed)
Last office visit 03/17/23 for CPE.  Last refilled 06/16/23 for #12 with no refills.  Last Vit D level 06/09/23 which was low at 18.3 ng/mL.  Next Appt: CPE 03/18/2024.

## 2023-10-01 NOTE — Telephone Encounter (Signed)
Prescription Request  10/01/2023  LOV: 03/17/2023  What is the name of the medication or equipment? Vitamin D, Ergocalciferol, (DRISDOL) 1.25 MG (50000 UNIT) CAPS capsule    Have you contacted your pharmacy to request a refill? Yes   Which pharmacy would you like this sent to?  [id number] [place], [place] - [address] AT [facility] OF [address] [place] [address] Phone: [phone] Fax: [phone]   Patient notified that their request is being sent to the clinical staff for review and that they should receive a response within 2 business days.   Please advise at Mobile [phone] (mobile)

## 2023-10-01 NOTE — Telephone Encounter (Signed)
Refilled

## 2023-12-25 ENCOUNTER — Other Ambulatory Visit: Payer: Self-pay | Admitting: Family Medicine

## 2023-12-27 NOTE — Telephone Encounter (Signed)
Last office visit 03/17/2023 for CPE.  Last refilled 10/01/2023 for #12 with no refills.  Vit D level 07/06/23 low at 18.3 ng/mL.  Next Appt: CPE 03/21/2024.

## 2024-03-17 ENCOUNTER — Telehealth: Payer: Self-pay | Admitting: Family Medicine

## 2024-03-17 ENCOUNTER — Other Ambulatory Visit: Payer: Self-pay | Admitting: Family Medicine

## 2024-03-17 DIAGNOSIS — E7800 Pure hypercholesterolemia, unspecified: Secondary | ICD-10-CM

## 2024-03-17 DIAGNOSIS — E559 Vitamin D deficiency, unspecified: Secondary | ICD-10-CM

## 2024-03-17 NOTE — Telephone Encounter (Signed)
 Last office visit 03/17/23 for CPE.  Last refilled 12/28/2023 for #12 with no refills.  Vit D level 06/09/2023 low at 18.3 ng/ml.  Next Appt: No future appointments.  Please call and schedule CPE with fasting labs prior with Dr. Ermalene Searing.

## 2024-03-17 NOTE — Addendum Note (Signed)
 Addended by: Alvina Chou on: 03/17/2024 10:43 AM   Modules accepted: Orders

## 2024-03-17 NOTE — Telephone Encounter (Signed)
 Spoke to pt, scheduled cpe for 04/07/24. Pt requested lab orders be [facility]?

## 2024-03-21 ENCOUNTER — Encounter: Payer: Managed Care, Other (non HMO) | Admitting: Family Medicine

## 2024-04-01 LAB — COMPREHENSIVE METABOLIC PANEL WITH GFR
ALT: 25 IU/L (ref 0–32)
AST: 12 IU/L (ref 0–40)
Albumin: 4.2 g/dL (ref 3.8–4.9)
Alkaline Phosphatase: 100 IU/L (ref 44–121)
BUN/Creatinine Ratio: 15 (ref 9–23)
BUN: 13 mg/dL (ref 6–24)
Bilirubin Total: 0.4 mg/dL (ref 0.0–1.2)
CO2: 22 mmol/L (ref 20–29)
Calcium: 9.2 mg/dL (ref 8.7–10.2)
Chloride: 106 mmol/L (ref 96–106)
Creatinine, Ser: 0.85 mg/dL (ref 0.57–1.00)
Globulin, Total: 2.6 g/dL (ref 1.5–4.5)
Glucose: 99 mg/dL (ref 70–99)
Potassium: 3.7 mmol/L (ref 3.5–5.2)
Sodium: 144 mmol/L (ref 134–144)
Total Protein: 6.8 g/dL (ref 6.0–8.5)
eGFR: 80 mL/min/{1.73_m2} (ref >59)

## 2024-04-01 LAB — LIPID PANEL
Chol/HDL Ratio: 5.4 ratio — ABNORMAL HIGH (ref 0.0–4.4)
Cholesterol, Total: 258 mg/dL — ABNORMAL HIGH (ref 100–199)
HDL: 48 mg/dL (ref >39)
LDL Chol Calc (NIH): 173 mg/dL — ABNORMAL HIGH (ref 0–99)
Triglycerides: 199 mg/dL — ABNORMAL HIGH (ref 0–149)
VLDL Cholesterol Cal: 37 mg/dL (ref 5–40)

## 2024-04-01 LAB — VITAMIN D 25 HYDROXY (VIT D DEFICIENCY, FRACTURES): Vit D, 25-Hydroxy: 31.3 ng/mL (ref 30.0–100.0)

## 2024-04-03 ENCOUNTER — Encounter: Payer: Self-pay | Admitting: Family Medicine

## 2024-04-03 NOTE — Progress Notes (Signed)
 No critical labs need to be addressed urgently. We will discuss labs in detail at upcoming office visit.

## 2024-04-07 ENCOUNTER — Ambulatory Visit (INDEPENDENT_AMBULATORY_CARE_PROVIDER_SITE_OTHER): Payer: Self-pay | Admitting: Family Medicine

## 2024-04-07 ENCOUNTER — Encounter: Payer: Self-pay | Admitting: Family Medicine

## 2024-04-07 VITALS — BP 140/100 | HR 83 | Temp 99.0°F | Ht 62.75 in | Wt 191.1 lb

## 2024-04-07 DIAGNOSIS — Z6834 Body mass index (BMI) 34.0-34.9, adult: Secondary | ICD-10-CM

## 2024-04-07 DIAGNOSIS — E559 Vitamin D deficiency, unspecified: Secondary | ICD-10-CM

## 2024-04-07 DIAGNOSIS — E6609 Other obesity due to excess calories: Secondary | ICD-10-CM

## 2024-04-07 DIAGNOSIS — K5909 Other constipation: Secondary | ICD-10-CM | POA: Insufficient documentation

## 2024-04-07 DIAGNOSIS — E7800 Pure hypercholesterolemia, unspecified: Secondary | ICD-10-CM

## 2024-04-07 DIAGNOSIS — E210 Primary hyperparathyroidism: Secondary | ICD-10-CM

## 2024-04-07 DIAGNOSIS — Z0000 Encounter for general adult medical examination without abnormal findings: Secondary | ICD-10-CM

## 2024-04-07 DIAGNOSIS — E66811 Obesity, class 1: Secondary | ICD-10-CM

## 2024-04-07 DIAGNOSIS — Z1231 Encounter for screening mammogram for malignant neoplasm of breast: Secondary | ICD-10-CM

## 2024-04-07 DIAGNOSIS — R030 Elevated blood-pressure reading, without diagnosis of hypertension: Secondary | ICD-10-CM

## 2024-04-07 NOTE — Assessment & Plan Note (Signed)
 Status post parathyroidectomy Calcium and vitamin D  in the normal range

## 2024-04-07 NOTE — Progress Notes (Signed)
 Patient ID: Colleen Haley, female    DOB: 09/24/1966, 58 y.o.   MRN: [id number]  This visit was conducted in person.  BP (!) 140/100   Pulse 83   Temp 99 F (37.2 C) (Temporal)   Ht 5' 2.75" (1.594 m)   Wt 191 lb 2 oz (86.7 kg)   LMP 09/10/2012   SpO2 96%   BMI 34.13 kg/m    CC:  Chief Complaint  Patient presents with   Annual Exam    Subjective:   HPI: Colleen Haley is a 58 y.o. female presenting on 04/07/2024 for Annual Exam  The patient presents for annual medicare wellness, complete physical and review of chronic health problems. He/She also has the following acute concerns today:  Elevated BP in past without HTN .Aaron Aas Not checking at home.  Brother in law passed, poor sleep lately. BP Readings from Last 3 Encounters:  04/07/24 (!) 140/100  03/17/23 116/84  02/10/23 (!) 132/92   Vit d def resolved on supplementation  Hypercalcemia: PTH in 05/2022 86, Ca 10.6.Aaron AasAaron Aas consistent with primary Hyperparathyroid.. referred to ENDO...   Now S/P parathyroidectomy 01/2024 Calcium now in normal range, vit D also now in nml range with supplement.  Elevated Cholesterol:  Inadequate control give diet... not exercising,  eating too many chips. Lab Results  Component Value Date   CHOL 258 (H) 03/31/2024   HDL 48 03/31/2024   LDLCALC 173 (H) 03/31/2024   LDLDIRECT 152 (H) 11/21/2021   TRIG 199 (H) 03/31/2024   CHOLHDL 5.4 (H) 03/31/2024  The 10-year ASCVD risk score (Arnett DK, et al., 2019) is: 6.8%   Values used to calculate the score:     Age: 75 years     Sex: Female     Is Non-Hispanic African American: Yes     Diabetic: No     Tobacco smoker: No     Systolic Blood Pressure: 140 mmHg     Is BP treated: No     HDL Cholesterol: 48 mg/dL     Total Cholesterol: 258 mg/dL  Using medications without problems: Muscle aches:  none Diet compliance: she has decreased butter  and chol in diet Exercise:  sporadic treadmill/ elliptical  Other complaints:  Anxiety:  In past  during work and school.  Was temporarily on medication 5 years ago  No issues, in remission.      S/P TAH... S/P bladder tack for stress incontinence.  Relevant past medical, surgical, family and social history reviewed and updated as indicated. Interim medical history since our last visit reviewed. Allergies and medications reviewed and updated. Outpatient Medications Prior to Visit  Medication Sig Dispense Refill   ibuprofen  (ADVIL ) 800 MG tablet TAKE 1 TABLET BY MOUTH DAILY AS  NEEDED FOR PAIN OR HEADACHE 90 tablet 3   Vitamin D , Ergocalciferol , (DRISDOL ) 1.25 MG (50000 UNIT) CAPS capsule TAKE 1 CAPSULE BY MOUTH EVERY 7 DAYS 12 capsule 0   No facility-administered medications prior to visit.     Per HPI unless specifically indicated in ROS section below Review of Systems  Constitutional:  Negative for fatigue and fever.  HENT:  Negative for congestion.   Eyes:  Negative for pain.  Respiratory:  Negative for cough and shortness of breath.   Cardiovascular:  Negative for chest pain, palpitations and leg swelling.  Gastrointestinal:  Negative for abdominal pain.  Genitourinary:  Negative for dysuria and vaginal bleeding.  Musculoskeletal:  Negative for back pain.  Neurological:  Negative for syncope, light-headedness and  headaches.  Psychiatric/Behavioral:  Negative for dysphoric mood.    Objective:  BP (!) 140/100   Pulse 83   Temp 99 F (37.2 C) (Temporal)   Ht 5' 2.75" (1.594 m)   Wt 191 lb 2 oz (86.7 kg)   LMP 09/10/2012   SpO2 96%   BMI 34.13 kg/m   Wt Readings from Last 3 Encounters:  04/07/24 191 lb 2 oz (86.7 kg)  03/17/23 172 lb (78 kg)  02/10/23 168 lb (76.2 kg)      Physical Exam Vitals and nursing note reviewed.  Constitutional:      General: She is not in acute distress.    Appearance: Normal appearance. She is well-developed. She is obese. She is not ill-appearing or toxic-appearing.  HENT:     Head: Normocephalic.     Right Ear: Hearing, tympanic  membrane, ear canal and external ear normal.     Left Ear: Hearing, tympanic membrane, ear canal and external ear normal.     Nose: Nose normal.  Eyes:     General: Lids are normal. Lids are everted, no foreign bodies appreciated.     Conjunctiva/sclera: Conjunctivae normal.     Pupils: Pupils are equal, round, and reactive to light.  Neck:     Thyroid : No thyroid  mass or thyromegaly.     Vascular: No carotid bruit.     Trachea: Trachea normal.  Cardiovascular:     Rate and Rhythm: Normal rate and regular rhythm.     Heart sounds: Normal heart sounds, S1 normal and S2 normal. No murmur heard.    No gallop.  Pulmonary:     Effort: Pulmonary effort is normal. No respiratory distress.     Breath sounds: Normal breath sounds. No wheezing, rhonchi or rales.  Abdominal:     General: Bowel sounds are normal. There is no distension or abdominal bruit.     Palpations: Abdomen is soft. There is no fluid wave or mass.     Tenderness: There is no abdominal tenderness. There is no guarding or rebound.     Hernia: No hernia is present.  Musculoskeletal:     Cervical back: Normal range of motion and neck supple.  Lymphadenopathy:     Cervical: No cervical adenopathy.  Skin:    General: Skin is warm and dry.     Findings: No rash.  Neurological:     Mental Status: She is alert.     Cranial Nerves: No cranial nerve deficit.     Sensory: No sensory deficit.  Psychiatric:        Mood and Affect: Mood is not anxious or depressed.        Speech: Speech normal.        Behavior: Behavior normal. Behavior is cooperative.        Judgment: Judgment normal.       Results for orders placed or performed in visit on 03/17/24  Comprehensive metabolic panel   Collection Time: 03/31/24  9:08 AM  Result Value Ref Range   Glucose 99 70 - 99 mg/dL   BUN 13 6 - 24 mg/dL   Creatinine, Ser 1.61 0.57 - 1.00 mg/dL   eGFR 80 >09 UE/AVW/0.98   BUN/Creatinine Ratio 15 9 - 23   Sodium 144 134 - 144 mmol/L    Potassium 3.7 3.5 - 5.2 mmol/L   Chloride 106 96 - 106 mmol/L   CO2 22 20 - 29 mmol/L   Calcium 9.2 8.7 - 10.2 mg/dL   Total  Protein 6.8 6.0 - 8.5 g/dL   Albumin 4.2 3.8 - 4.9 g/dL   Globulin, Total 2.6 1.5 - 4.5 g/dL   Bilirubin Total 0.4 0.0 - 1.2 mg/dL   Alkaline Phosphatase 100 44 - 121 IU/L   AST 12 0 - 40 IU/L   ALT 25 0 - 32 IU/L  Lipid panel   Collection Time: 03/31/24  9:08 AM  Result Value Ref Range   Cholesterol, Total 258 (H) 100 - 199 mg/dL   Triglycerides 161 (H) 0 - 149 mg/dL   HDL 48 >09 mg/dL   VLDL Cholesterol Cal 37 5 - 40 mg/dL   LDL Chol Calc (NIH) 604 (H) 0 - 99 mg/dL   Chol/HDL Ratio 5.4 (H) 0.0 - 4.4 ratio  VITAMIN D  25 Hydroxy (Vit-D Deficiency, Fractures)   Collection Time: 03/31/24  9:08 AM  Result Value Ref Range   Vit D, 25-Hydroxy 31.3 30.0 - 100.0 ng/mL    Assessment and Plan The patient's preventative maintenance and recommended screening tests for an annual wellness exam were reviewed in full today. Brought up to date unless services declined.  Counselled on the importance of diet, exercise, and its role in overall health and mortality. The patient's FH and SH was reviewed, including their home life, tobacco status, and drug and alcohol status.   Vaccines: Consider shingrix Pap/DVE:  TAH Mammo:  DUE Bone Density: none Colon:2019 nml,  Dr. Ole Berkeley. Repeat in 10 years   Smoking Status: none ETOH/ drug use:  none/none  Hep C:  done  HIV screen:   refused.  Routine general medical examination at a health care facility  Encounter for screening mammogram for malignant neoplasm of breast -     3D Screening Mammogram, Left and Right; Future  Primary hyperparathyroidism (HCC) Assessment & Plan: Status post parathyroidectomy Calcium and vitamin D  in the normal range   Elevated BP without diagnosis of hypertension Assessment & Plan: Elevated despite repeat check in office today.  Will have her follow blood pressure at home.  She will call if  blood pressure continuing to be above goal 140/90. Encouraged her to decrease caffeine, work on regular exercise and weight management.   Vitamin D  deficiency Assessment & Plan: Chronic, resolved on supplementation   Class 1 obesity due to excess calories with serious comorbidity and body mass index (BMI) of 34.0 to 34.9 in adult Assessment & Plan: Chronic, encouraged regular exercise heart healthy diet and weight loss.   Chronic constipation Assessment & Plan: Chronic, encouraged her to continue working on increasing fiber in diet, water  and start exercise.  She can use MiraLAX but increase to twice daily given once daily ineffective.  She has also taken Linzess  in the past which helped but she was disappointed that her symptoms came back right when she stopped.   Hypercholesteremia Assessment & Plan: Chronic, inadequate control Discussed lifestyle changes.  Recheck in 6 months.      Return in about 6 months (around 10/08/2024) for lab only.   Herby Lolling, MD

## 2024-04-07 NOTE — Assessment & Plan Note (Signed)
 Chronic, encouraged her to continue working on increasing fiber in diet, water  and start exercise.  She can use MiraLAX but increase to twice daily given once daily ineffective.  She has also taken Linzess  in the past which helped but she was disappointed that her symptoms came back right when she stopped.

## 2024-04-07 NOTE — Assessment & Plan Note (Signed)
 Chronic, inadequate control Discussed lifestyle changes.  Recheck in 6 months.

## 2024-04-07 NOTE — Assessment & Plan Note (Signed)
Chronic, resolved on supplementation. 

## 2024-04-07 NOTE — Assessment & Plan Note (Signed)
 Chronic, encouraged regular exercise heart healthy diet and weight loss.

## 2024-04-07 NOTE — Assessment & Plan Note (Signed)
 Elevated despite repeat check in office today.  Will have her follow blood pressure at home.  She will call if blood pressure continuing to be above goal 140/90. Encouraged her to decrease caffeine, work on regular exercise and weight management.

## 2024-04-07 NOTE — Patient Instructions (Addendum)
 Can try black cohosh for hot flashes.  Follow BP at home.. call > 140/90.   Can try miralax twice daily. Icnreae water  , fiber and exercise.  Please call the location of your choice from the menu below to schedule your Mammogram and/or Bone Density appointment.    [facility]   [facility]                      Phone:  [phone] [address]                               [place], [place] [address]                                                             Services: Traditional and 3D Mammogram, Bone Density   [facility]                 Phone: [phone] [address]                                                       [place], [place] [address]    Service: Bone Density ONLY   *this site does NOT perform mammograms  [facility]                        Phone:  [phone] [address]                                  [place], [place] [address]                                            Services:  3D Mammogram and Bone Density    [facility]   Phone:  [phone]   [address]                                                                            [place], [place] [address]                                            Services: 3D Mammogram and Bone [facility])  Phone:  [phone]   [address]  [place], [place] [address]                                              Services:  3D Mammogram and Bone Density

## 2024-05-22 ENCOUNTER — Other Ambulatory Visit: Payer: Self-pay | Admitting: Family Medicine

## 2024-05-23 NOTE — Telephone Encounter (Signed)
 Last office visit 05/ for CPE. Last refilled 05/13/2023 for #90 with no refills. Next Appt: CPE 03/21/2024.

## 2024-05-23 NOTE — Telephone Encounter (Signed)
 Last office visit 04/07/24 for CPE. Last refilled 07/09/23 for #90 with 3 refills. Next Appt: Not scheduled

## 2024-06-07 ENCOUNTER — Other Ambulatory Visit: Payer: Self-pay | Admitting: Family Medicine

## 2024-06-07 NOTE — Telephone Encounter (Signed)
 Last office visit 04/07/24 for CPE.  Last refilled 03/17/2024 for #12 with no refills.  VIt D level 03/31/24 which was normal at 31.3 ng/mL.  Next Appt: No future appointments.

## 2024-10-02 ENCOUNTER — Ambulatory Visit: Payer: Self-pay | Admitting: Family Medicine

## 2024-10-02 NOTE — Telephone Encounter (Signed)
 Appointment 10/12/24 with Dr. Avelina.

## 2024-10-02 NOTE — Telephone Encounter (Signed)
      FYI Only or Action Required?: Action required by provider: request for appointment.  Patient was last seen in primary care on 04/07/2024 by Avelina Greig BRAVO, MD.  Called Nurse Triage reporting urinary symptoms.  Symptoms began about a month ago.  Interventions attempted: Nothing.  Symptoms are: unchanged. Sees blood after urination when wiping. Mild discomfort. Has difficulty getting off work for appointment.  Triage Disposition: See PCP Within 2 Weeks  Patient/caregiver understands and will follow disposition?: YesCopied from [facility] [phone]. Topic: Clinical - Red Word Triage >> Oct 02, 2024 10:23 AM Pinkey ORN wrote: Red Word that prompted transfer to Nurse Triage: Sight Of Blood >> Oct 02, 2024 10:25 AM Pinkey ORN wrote: Patient states when she urinate there's no sight of blood, but when she wipes there's blood. Patient denies having any other problems or symptoms.  Answer Assessment - Initial Assessment Questions 1. SYMPTOM: What's the main symptom you're concerned about? (e.g., frequency, incontinence)     Blood when voids 2. ONSET: When did the    start?     2 months 3. PAIN: Is there any pain? If Yes, ask: How bad is it? (Scale: 1-10; mild, moderate, severe)     mild 4. CAUSE: What do you think is causing the symptoms?     unsure 5. OTHER SYMPTOMS: Do you have any other symptoms? (e.g., blood in urine, fever, flank pain, pain with urination)     Mild pain 6. PREGNANCY: Is there any chance you are pregnant? When was your last menstrual period?     no  Protocols used: Urinary Symptoms-A-AH  Reason for Disposition  All other urine symptoms  Answer Assessment - Initial Assessment Questions 1. SYMPTOM: What's the main symptom you're concerned about? (e.g., frequency, incontinence)     Blood when voids 2. ONSET: When did the    start?     2 months 3. PAIN: Is there any pain? If Yes, ask: How bad is it? (Scale: 1-10; mild, moderate, severe)      mild 4. CAUSE: What do you think is causing the symptoms?     unsure 5. OTHER SYMPTOMS: Do you have any other symptoms? (e.g., blood in urine, fever, flank pain, pain with urination)     Mild pain 6. PREGNANCY: Is there any chance you are pregnant? When was your last menstrual period?     no  Protocols used: Urinary Symptoms-A-AH

## 2024-10-06 ENCOUNTER — Ambulatory Visit: Payer: Self-pay | Admitting: Family Medicine

## 2024-10-12 ENCOUNTER — Ambulatory Visit (INDEPENDENT_AMBULATORY_CARE_PROVIDER_SITE_OTHER): Admitting: Family Medicine

## 2024-10-12 VITALS — BP 130/86 | HR 75 | Temp 98.2°F | Ht 62.75 in | Wt 170.8 lb

## 2024-10-12 DIAGNOSIS — N950 Postmenopausal bleeding: Secondary | ICD-10-CM | POA: Diagnosis not present

## 2024-10-12 DIAGNOSIS — R319 Hematuria, unspecified: Secondary | ICD-10-CM | POA: Diagnosis not present

## 2024-10-12 LAB — POC URINALSYSI DIPSTICK (AUTOMATED)
Bilirubin, UA: NEGATIVE
Clarity, UA: NEGATIVE
Glucose, UA: NEGATIVE
Ketones, UA: NEGATIVE
Nitrite, UA: NEGATIVE
Protein, UA: NEGATIVE
Spec Grav, UA: 1.02 (ref 1.010–1.025)
Urobilinogen, UA: 0.2 U/dL
pH, UA: 6 (ref 5.0–8.0)

## 2024-10-12 NOTE — Progress Notes (Unsigned)
 Patient ID: Colleen Haley, female    DOB: Feb 16, 1966, 58 y.o.   MRN: [id number]  This visit was conducted in person.  BP 130/86   Pulse 75   Temp 98.2 F (36.8 C) (Oral)   Ht 5' 2.75 (1.594 m)   Wt 170 lb 12.8 oz (77.5 kg)   LMP 09/10/2012   SpO2 97%   BMI 30.50 kg/m    CC:  Chief Complaint  Patient presents with   Hematuria    No blood in urine, blood in tissue when she wipes. Has been happening for about 3 months now.     Subjective:   HPI: Colleen Haley is a 58 y.o. female presenting on 10/12/2024 for Hematuria (No blood in urine, blood in tissue when she wipes. Has been happening for about 3 months now. )   3 months ago started noting after she urinates.. note bright red blood on toilet paper.  Occ on underwear.No dysuria, no urgency, no frequency.  She feels it is coming vaginally.  No blood in stool.  No sex in lat month.. married, no new partners.  No abd pain or pressure.   Has some vaginal dryness. No vaginal pain or lesions.   She had hysterectomy at 58 years old for fibroids..     Relevant past medical, surgical, family and social history reviewed and updated as indicated. Interim medical history since our last visit reviewed. Allergies and medications reviewed and updated. Outpatient Medications Prior to Visit  Medication Sig Dispense Refill   ibuprofen  (ADVIL ) 800 MG tablet TAKE 1 TABLET BY MOUTH DAILY AS  NEEDED FOR PAIN OR HEADACHE 90 tablet 3   Vitamin D , Ergocalciferol , (DRISDOL ) 1.25 MG (50000 UNIT) CAPS capsule TAKE 1 CAPSULE BY MOUTH EVERY 7 DAYS (Patient not taking: Reported on 10/12/2024) 12 capsule 0   No facility-administered medications prior to visit.     Per HPI unless specifically indicated in ROS section below Review of Systems Objective:  BP 130/86   Pulse 75   Temp 98.2 F (36.8 C) (Oral)   Ht 5' 2.75 (1.594 m)   Wt 170 lb 12.8 oz (77.5 kg)   LMP 09/10/2012   SpO2 97%   BMI 30.50 kg/m   Wt Readings from Last 3  Encounters:  10/12/24 170 lb 12.8 oz (77.5 kg)  04/07/24 191 lb 2 oz (86.7 kg)  03/17/23 172 lb (78 kg)      Physical Exam    Results for orders placed or performed in visit on 03/17/24  Comprehensive metabolic panel   Collection Time: 03/31/24  9:08 AM  Result Value Ref Range   Glucose 99 70 - 99 mg/dL   BUN 13 6 - 24 mg/dL   Creatinine, Ser 9.14 0.57 - 1.00 mg/dL   eGFR 80 >40 fO/fpw/8.26   BUN/Creatinine Ratio 15 9 - 23   Sodium 144 134 - 144 mmol/L   Potassium 3.7 3.5 - 5.2 mmol/L   Chloride 106 96 - 106 mmol/L   CO2 22 20 - 29 mmol/L   Calcium 9.2 8.7 - 10.2 mg/dL   Total Protein 6.8 6.0 - 8.5 g/dL   Albumin 4.2 3.8 - 4.9 g/dL   Globulin, Total 2.6 1.5 - 4.5 g/dL   Bilirubin Total 0.4 0.0 - 1.2 mg/dL   Alkaline Phosphatase 100 44 - 121 IU/L   AST 12 0 - 40 IU/L   ALT 25 0 - 32 IU/L  Lipid panel   Collection Time: 03/31/24  9:08 AM  Result Value Ref Range   Cholesterol, Total 258 (H) 100 - 199 mg/dL   Triglycerides 800 (H) 0 - 149 mg/dL   HDL 48 >60 mg/dL   VLDL Cholesterol Cal 37 5 - 40 mg/dL   LDL Chol Calc (NIH) 826 (H) 0 - 99 mg/dL   Chol/HDL Ratio 5.4 (H) 0.0 - 4.4 ratio  VITAMIN D  25 Hydroxy (Vit-D Deficiency, Fractures)   Collection Time: 03/31/24  9:08 AM  Result Value Ref Range   Vit D, 25-Hydroxy 31.3 30.0 - 100.0 ng/mL    Assessment and Plan  There are no diagnoses linked to this encounter.  No follow-ups on file.   Greig Ring, MD

## 2024-10-13 DIAGNOSIS — N950 Postmenopausal bleeding: Secondary | ICD-10-CM | POA: Insufficient documentation

## 2024-10-13 LAB — POCT UA - MICROSCOPIC ONLY

## 2024-10-13 NOTE — Assessment & Plan Note (Signed)
 Acute, normal genitourinary exam.  No clear source of blood seen on toilet tissue.  Patient's status post hysterectomy.  No vaginal lesions. Patient is without symptoms of UTI, but trace blood and white blood cells seen on urinalysis.  Will send urine for culture to verify no bacterial infection.  Possible postmenopausal vaginal dryness causing abrasions and bleeding.  None seen on exam today and tissues fairly moist.  If  culture she continues to have bleeding I will send her to GYN for further evaluation versus trial of estrogen cream.

## 2024-10-14 LAB — URINE CULTURE
MICRO NUMBER:: 17206678
Result:: NO GROWTH
SPECIMEN QUALITY:: ADEQUATE

## 2024-10-15 ENCOUNTER — Ambulatory Visit: Payer: Self-pay | Admitting: Family Medicine

## 2024-11-27 ENCOUNTER — Encounter

## 2024-12-04 ENCOUNTER — Ambulatory Visit
Admission: RE | Admit: 2024-12-04 | Discharge: 2024-12-04 | Disposition: A | Source: Ambulatory Visit | Attending: Family Medicine | Admitting: Family Medicine

## 2024-12-04 DIAGNOSIS — Z1231 Encounter for screening mammogram for malignant neoplasm of breast: Secondary | ICD-10-CM | POA: Diagnosis present

## 2024-12-08 ENCOUNTER — Ambulatory Visit: Payer: Self-pay | Admitting: Family Medicine

## 2024-12-08 NOTE — Progress Notes (Signed)
 No critical labs need to be addressed urgently. We will discuss labs in detail at upcoming office visit.

## 2025-04-10 ENCOUNTER — Encounter: Admitting: Family Medicine
